# Patient Record
Sex: Male | Born: 1950 | Race: Black or African American | Hispanic: No | Marital: Single | Smoking: Never smoker
Health system: Southern US, Community
[De-identification: ages and names within clinical notes are randomized; demographics above are authoritative.]

## PROBLEM LIST (undated history)

## (undated) DIAGNOSIS — IMO0001 Reserved for inherently not codable concepts without codable children: Secondary | ICD-10-CM

## (undated) DIAGNOSIS — H9190 Unspecified hearing loss, unspecified ear: Secondary | ICD-10-CM

## (undated) DIAGNOSIS — H5200 Hypermetropia, unspecified eye: Secondary | ICD-10-CM

## (undated) HISTORY — DX: Hypermetropia, unspecified eye: H52.00

## (undated) HISTORY — DX: Reserved for inherently not codable concepts without codable children: IMO0001

## (undated) HISTORY — DX: Unspecified hearing loss, unspecified ear: H91.90

---

## 2020-04-21 ENCOUNTER — Ambulatory Visit: Payer: Self-pay | Admitting: Family Medicine

## 2020-04-21 DIAGNOSIS — Z0289 Encounter for other administrative examinations: Secondary | ICD-10-CM | POA: Insufficient documentation

## 2020-05-22 DIAGNOSIS — Z049 Encounter for examination and observation for unspecified reason: Secondary | ICD-10-CM | POA: Diagnosis not present

## 2020-05-22 DIAGNOSIS — Z23 Encounter for immunization: Secondary | ICD-10-CM | POA: Diagnosis not present

## 2020-05-22 DIAGNOSIS — Z114 Encounter for screening for human immunodeficiency virus [HIV]: Secondary | ICD-10-CM | POA: Diagnosis not present

## 2020-06-03 ENCOUNTER — Other Ambulatory Visit (HOSPITAL_COMMUNITY)
Admission: RE | Admit: 2020-06-03 | Discharge: 2020-06-03 | Disposition: A | Discharge status: Home / Self Care | Payer: Medicaid Other | Source: Ambulatory Visit | Attending: Family Medicine | Admitting: Family Medicine

## 2020-06-03 ENCOUNTER — Ambulatory Visit (INDEPENDENT_AMBULATORY_CARE_PROVIDER_SITE_OTHER): Payer: Medicaid Other | Admitting: Family Medicine

## 2020-06-03 ENCOUNTER — Other Ambulatory Visit: Payer: Self-pay

## 2020-06-03 VITALS — BP 110/66 | HR 83 | Ht 70.0 in | Wt 151.0 lb

## 2020-06-03 DIAGNOSIS — H547 Unspecified visual loss: Secondary | ICD-10-CM

## 2020-06-03 DIAGNOSIS — Z0289 Encounter for other administrative examinations: Secondary | ICD-10-CM

## 2020-06-03 DIAGNOSIS — J929 Pleural plaque without asbestos: Secondary | ICD-10-CM | POA: Diagnosis not present

## 2020-06-03 NOTE — Assessment & Plan Note (Signed)
Pleural thickening noted on overseas CXR. DDX includes pleural effusion, occupational lung disease, mesothelioma. -Repeat CXR

## 2020-06-03 NOTE — Progress Notes (Signed)
Patient Name: Jeremy Rivas Date of Birth: Jan 12, 1951 Date of Visit: 06/03/20 PCP: Martyn Malay, MD  Chief Complaint: refugee intake examination, muffled hearing and blurred vision.  The patient's preferred language is Kinyarwanda. An interpreter was used for the entire visit.  Interpreter Name or ID: Rosanna Randy    Subjective: Jeremy Rivas is a pleasant 69 y.o. presenting today for an initial refugee and immigrant clinic visit.  Patient reports muffled hearing since he boarded the airplane coming over to the country. He has attempted popping his ears with no improvement.  Patient also reports blurred vision, which he attributes as being related to the muffled hearing. He states his vision is not always blurry and he has never had his eyes evaluated before. Patient reports previously using reading glasses for a short time period with some improvement.    ROS:  Denies weight loss, night sweats, cough   PMH: None  PSH: None   FH: Unsure   Allergies:  None   Current Medications:  None   Social History: Tobacco Use: Never smoke Alcohol Use: yes, twice a week beer  Refugee Information Number of Immediate Family Members: 6 Number of Immediate Family Members in [place]: 6 Date of Arrival: 03/26/20 Country of Birth: Other Other Country of Birth:: [place]:: [place]: Other Other Location of [place]:: [place] Duration in [place]: 16-19 years Reason for [place]: Political opinion Primary Language: Kinyarwanda/Rwanda Able to Read in Primary Language: Yes Able to Write in Primary Language: Yes Education: None Prior Work: Psychologist, sport and exercise Marital Status: Married Health Department Labs Completed: Yes   Date of Overseas Exam: 03/08/2017 Review of Overseas Exam: 06/03/2020 Pre-Departure Treatment: albendazole, coartem, PZQ  Overseas Vaccines Reviewed and Updated in [place] 06/03/2020     Vitals:   06/03/20 1417  BP: 110/66  Pulse: 83  SpO2: 96%   HEENT: Sclera anicteric. Dentition is poor. Appears well hydrated. Neck: Supple Cardiac: Regular rate and rhythm. Normal S1/S2. No murmurs, rubs, or gallops appreciated. Lungs: Clear bilaterally to ascultation.  Abdomen: Normoactive bowel sounds. No tenderness to deep or light palpation. No rebound. Difficult to appreciate liver and spleen due to voluntarily tensed abdominal muscles Extremities: Warm, well perfused without edema.  Skin: no obvious rashes or lesions  Psych: Pleasant and appropriate  MSK: 5/5 strength in bilateral UE and LE   Pleural thickening Pleural thickening noted on overseas CXR. DDX includes pleural effusion, occupational lung disease, mesothelioma. -Repeat CXR  Vision impairment Occasional blurred vision, previously used reading glasses with some improvement. No associated symptoms including headaches, N/V. BP in office today 110/66, not likely related to blood pressure elevations.  -Opthalmology referral placed -Will follow-up blood sugar levels to ensure diabetes not a contributing factor.   Designated Ad[employer] account planner signed with agency Yes.   Release of information signed for Health Department Yes.   Return to care in 1 month in [facility] with resident physician and PCP.   Vaccines: 2nd COVID vaccine due 12/9 at HD.   To follow-up: -Repeat spleen exam, patient unable to fully relax abdomen during exam  -Further investigate alcohol use, patient did not quantify how many alcoholic drinks he has when he drinks. -Follow-up hearing, consider audiology referral. Also counsel patient on appropriate ear cleaning techniques.  - Review NCIR for MMR vaccine--only received Hep B and Td overseas.  - Needs UA and Urine Cytology at follow up.   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 For congestion--consider flonase.   I discussed the plan of care with the  resident physician and agree with below documentation.  Dorris Singh, MD

## 2020-06-03 NOTE — Patient Instructions (Signed)
It was wonderful to see you today.  Please bring ALL of your medications with you to every visit.   Today we talked about:  - Referral to [facility] will call Huntley Dec with your appointment   An x-ray was ordered for you---you do not need an appointment to have this completed.  I recommend going to [facility] [address] [place] [phone] [place]   If the results are normal,I will send you a letter  I will call you with results if anything is abnormal    We will call you with blood work    Thank you for choosing [facility].   Please call [phone] with any questions about today's appointment.  Please be sure to schedule follow up at the front  desk before you leave today.   Terisa Starr, MD  Family Medicine

## 2020-06-03 NOTE — Assessment & Plan Note (Addendum)
Occasional blurred vision, previously used reading glasses with some improvement. No associated symptoms including headaches, N/V. BP in office today 110/66, not likely related to blood pressure elevations.  -Opthalmology referral placed -Will follow-up blood sugar levels to ensure diabetes not a contributing factor.

## 2020-06-04 ENCOUNTER — Ambulatory Visit
Admission: RE | Admit: 2020-06-04 | Discharge: 2020-06-04 | Disposition: A | Discharge status: Home / Self Care | Payer: No Typology Code available for payment source | Source: Ambulatory Visit | Attending: Family Medicine | Admitting: Family Medicine

## 2020-06-04 DIAGNOSIS — J929 Pleural plaque without asbestos: Secondary | ICD-10-CM

## 2020-06-05 ENCOUNTER — Telehealth: Payer: Self-pay

## 2020-06-05 LAB — URINE CYTOLOGY ANCILLARY ONLY
Chlamydia: NEGATIVE
Comment: NEGATIVE
Comment: NORMAL
Neisseria Gonorrhea: NEGATIVE

## 2020-06-05 NOTE — Telephone Encounter (Signed)
[facility] Radiology LVM on nurse line making sure PCP is aware of recent Xray findings. I called them back to inform we have received report and will send a message to provider.

## 2020-06-05 NOTE — Telephone Encounter (Signed)
Noted. Will need CT of chest to further evaluate.  Will discuss with PCP, Dr. Wynelle Link, and patient.  Terisa Starr, MD  Family Medicine Teaching Service

## 2020-06-06 LAB — COMPREHENSIVE METABOLIC PANEL
ALT: 14 IU/L (ref 0–44)
AST: 21 IU/L (ref 0–40)
Albumin/Globulin Ratio: 1.1 — ABNORMAL LOW (ref 1.2–2.2)
Albumin: 4 g/dL (ref 3.8–4.8)
Alkaline Phosphatase: 63 IU/L (ref 44–121)
BUN/Creatinine Ratio: 17 (ref 10–24)
BUN: 13 mg/dL (ref 8–27)
Bilirubin Total: <0.2 mg/dL (ref 0.0–1.2)
CO2: 24 mmol/L (ref 20–29)
Calcium: 8.5 mg/dL — ABNORMAL LOW (ref 8.6–10.2)
Chloride: 103 mmol/L (ref 96–106)
Creatinine, Ser: 0.77 mg/dL (ref 0.76–1.27)
GFR calc Af Amer: 107 mL/min/{1.73_m2} (ref >59)
GFR calc non Af Amer: 93 mL/min/{1.73_m2} (ref >59)
Globulin, Total: 3.6 g/dL (ref 1.5–4.5)
Glucose: 101 mg/dL — ABNORMAL HIGH (ref 65–99)
Potassium: 4.9 mmol/L (ref 3.5–5.2)
Sodium: 138 mmol/L (ref 134–144)
Total Protein: 7.6 g/dL (ref 6.0–8.5)

## 2020-06-06 LAB — HEPATITIS B SURFACE ANTIGEN: Hepatitis B Surface Ag: NEGATIVE

## 2020-06-06 LAB — CBC WITH DIFFERENTIAL/PLATELET
Basophils Absolute: 0 10*3/uL (ref 0.0–0.2)
Basos: 1 %
EOS (ABSOLUTE): 0.5 10*3/uL — ABNORMAL HIGH (ref 0.0–0.4)
Eos: 12 %
Hematocrit: 40.6 % (ref 37.5–51.0)
Hemoglobin: 13.9 g/dL (ref 13.0–17.7)
Immature Grans (Abs): 0 10*3/uL (ref 0.0–0.1)
Immature Granulocytes: 1 %
Lymphocytes Absolute: 1.2 10*3/uL (ref 0.7–3.1)
Lymphs: 32 %
MCH: 30.8 pg (ref 26.6–33.0)
MCHC: 34.2 g/dL (ref 31.5–35.7)
MCV: 90 fL (ref 79–97)
Monocytes Absolute: 0.4 10*3/uL (ref 0.1–0.9)
Monocytes: 9 %
Neutrophils Absolute: 1.7 10*3/uL (ref 1.4–7.0)
Neutrophils: 45 %
Platelets: 232 10*3/uL (ref 150–450)
RBC: 4.51 x10E6/uL (ref 4.14–5.80)
RDW: 12.7 % (ref 11.6–15.4)
WBC: 3.8 10*3/uL (ref 3.4–10.8)

## 2020-06-06 LAB — LIPID PANEL
Chol/HDL Ratio: 2.7 ratio (ref 0.0–5.0)
Cholesterol, Total: 135 mg/dL (ref 100–199)
HDL: 50 mg/dL (ref >39)
LDL Chol Calc (NIH): 56 mg/dL (ref 0–99)
Triglycerides: 174 mg/dL — ABNORMAL HIGH (ref 0–149)
VLDL Cholesterol Cal: 29 mg/dL (ref 5–40)

## 2020-06-06 LAB — HEPATITIS B CORE ANTIBODY, TOTAL: Hep B Core Total Ab: POSITIVE — AB

## 2020-06-06 LAB — HEPATITIS B SURFACE ANTIBODY, QUANTITATIVE: Hepatitis B Surf Ab Quant: 17.9 m[IU]/mL (ref >9.9)

## 2020-06-06 LAB — RPR: RPR Ser Ql: NONREACTIVE

## 2020-06-06 LAB — STRONGYLOIDES, AB, IGG: Strongyloides, Ab, IgG: POSITIVE — AB

## 2020-06-06 LAB — HIV ANTIBODY (ROUTINE TESTING W REFLEX): HIV Screen 4th Generation wRfx: NONREACTIVE

## 2020-06-06 LAB — HCV AB W REFLEX TO QUANT PCR: HCV Ab: <0.1 s/co ratio (ref 0.0–0.9)

## 2020-06-06 LAB — TSH: TSH: 1.11 u[IU]/mL (ref 0.450–4.500)

## 2020-06-06 LAB — HCV INTERPRETATION

## 2020-06-08 ENCOUNTER — Telehealth: Payer: Self-pay | Admitting: Family Medicine

## 2020-06-08 DIAGNOSIS — B789 Strongyloidiasis, unspecified: Secondary | ICD-10-CM | POA: Insufficient documentation

## 2020-06-08 DIAGNOSIS — D721 Eosinophilia, unspecified: Secondary | ICD-10-CM | POA: Insufficient documentation

## 2020-06-08 DIAGNOSIS — J9859 Other diseases of mediastinum, not elsewhere classified: Secondary | ICD-10-CM | POA: Insufficient documentation

## 2020-06-08 NOTE — Telephone Encounter (Signed)
The patient speaks [place] Plants as their primary language.  An interpreter was used for the entire visit.   Unable to reach patient X2. I was able to reach daughter (she is not with patient). She ensured they will attend visit on 12/8.   Lab review - Hep B exposed, immune - Eosinophilia (will need repeat in 3-6 months, reminder to self) - Needs Loa loa as positive for strongyloides. If loa loa negative, will need treated for strongyloides and then a repeat CBC with diff (re: eosinophils)  - Hep C negative, HIV negative, RPR negative, GC/CT reviewed, negative  - Most important,  At follow up recommend: - Ordering and scheduling non-contrast chest CT. Please let patient know time, date of appointment. Please also let his case manager, Huntley Dec, know about the CT.  - Follow up visit in next 1 month between 04-1129 AM. He needs thick and thin smears between 10-2 PM. Appointment on 12/8 is too late to collect this.   Routing to provider and PCP.   Terisa Starr, MD  Family Medicine Teaching Service

## 2020-06-11 ENCOUNTER — Encounter: Payer: Self-pay | Admitting: Family Medicine

## 2020-06-11 ENCOUNTER — Other Ambulatory Visit: Payer: Self-pay

## 2020-06-11 ENCOUNTER — Ambulatory Visit (INDEPENDENT_AMBULATORY_CARE_PROVIDER_SITE_OTHER): Payer: Medicaid Other | Admitting: Family Medicine

## 2020-06-11 VITALS — BP 114/60 | HR 89 | Ht 70.0 in | Wt 153.4 lb

## 2020-06-11 DIAGNOSIS — D721 Eosinophilia, unspecified: Secondary | ICD-10-CM | POA: Diagnosis not present

## 2020-06-11 DIAGNOSIS — Z0289 Encounter for other administrative examinations: Secondary | ICD-10-CM

## 2020-06-11 DIAGNOSIS — Z289 Immunization not carried out for unspecified reason: Secondary | ICD-10-CM

## 2020-06-11 DIAGNOSIS — R911 Solitary pulmonary nodule: Secondary | ICD-10-CM

## 2020-06-11 DIAGNOSIS — B789 Strongyloidiasis, unspecified: Secondary | ICD-10-CM

## 2020-06-11 DIAGNOSIS — J9859 Other diseases of mediastinum, not elsewhere classified: Secondary | ICD-10-CM | POA: Diagnosis not present

## 2020-06-11 DIAGNOSIS — H6122 Impacted cerumen, left ear: Secondary | ICD-10-CM

## 2020-06-11 DIAGNOSIS — Z23 Encounter for immunization: Secondary | ICD-10-CM | POA: Diagnosis not present

## 2020-06-11 DIAGNOSIS — J929 Pleural plaque without asbestos: Secondary | ICD-10-CM | POA: Diagnosis not present

## 2020-06-11 LAB — POCT URINALYSIS DIP (CLINITEK)
Bilirubin, UA: NEGATIVE
Blood, UA: NEGATIVE
Glucose, UA: NEGATIVE mg/dL
Ketones, POC UA: NEGATIVE mg/dL
Leukocytes, UA: NEGATIVE
Nitrite, UA: NEGATIVE
POC PROTEIN,UA: NEGATIVE
Spec Grav, UA: 1.015 (ref 1.010–1.025)
Urobilinogen, UA: 0.2 E.U./dL
pH, UA: 6 (ref 5.0–8.0)

## 2020-06-11 LAB — POCT UA - MICROSCOPIC ONLY

## 2020-06-11 MED ORDER — DEBROX 6.5 % OT SOLN: 5.0000 [drp] | Freq: Two times a day (BID) | OTIC | 0 refills | Status: DC

## 2020-06-11 NOTE — Progress Notes (Addendum)
    SUBJECTIVE:   CHIEF COMPLAINT / HPI: f/u labs  Preferred language is Kinyarwanda. Interpreter used for the entire visit.  Most recently seen on 11/30 for refugee health examination. Was having muffled hearing since boarding plane coming over to the country. Still reporting muffled hearing today, mostly in left ear. Notable labs/imaging - strongyloides positive - eosinophilia 0.5 - hepatitis B: surface antigen negative, core antibody positive, surface antibody 17.9 (immune) - negative for hep C, HIV, RPR, GC/CT - CXR with 1cm mediastinal mass, see below  CXR 06/04/20 Nodular opacity in or overlying the superior anterior mediastinum seen only on lateral view. It is possible that this opacity represents an exostosis arising from the anterior first rib seen on frontal view. Noncontrast enhanced chest CT to further evaluate this area would be warranted.  Suspected prominent osteophyte along the right lower hemithorax. This area conceivably could represent localized pleural thickening or nodular lesion. Again, noncontrast CT could distinguish between these potential considerations.  Elsewhere lungs appear clear. Cardiac silhouette normal. No well-defined pleural thickening is evident by radiography. CT could be quite helpful for further assessment of the pleura. No adenopathy evident.  PERTINENT  PMH / PSH: pleural thickening, mediastinal mass, eosinophilia, strongyloides infection, vision impairment  OBJECTIVE:   BP 114/60   Pulse 89   Ht _0  (1.778 m)   Wt 153 lb 6 oz (69.6 kg)   SpO2 99%   BMI 22.01 kg/m   General: Elderly male, NAD Eyes: PERRL, EOMI HEENT: Right TM retracted, left TM occluded with cerumen CV: RRR, no murmurs Pulm: CTAB, no wheezes or rales Abdomen: no tenderness, voluntarily tensed abdominal muscles limiting evaluation of liver and spleen, +BS  ASSESSMENT/PLAN:   Pleural thickening Pleural thickening noted on overseas CXR. Repeat CXR  without well-defined pleural thickening. Will await CT chest.  Eosinophilia Noted on recent labs. Likely secondary to parasitic infection. - repeat in 3-6 months  Mediastinal mass 1cm mediastinal nonspecific mass noted on recent CXR, possibly representing exostosis from anterior first rib. 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 - needs thick and thin smears between 10am-2pm evaluate for loa loa, lab appt made 12/22 at 11:15 AM - treatment for strongyloides if negative   Unvaccinated for MMR Has not received MMR vaccine. - MMR immunity - MMR vaccine if not immune  Cerumen impaction, left Likely causing muffled hearing. Attempted ear cleaning x1 with minimal improvement, patient declined second attempt. - Debrox  HCM - Pfizer covid vaccine given today - needs colonoscopy - needs PNA vaccine  F/u scheduled on 07/14/20 at 3:45 PM  Zola Button, MD [place]

## 2020-06-11 NOTE — Assessment & Plan Note (Addendum)
Noted on recent labs. Likely secondary to parasitic infection. - repeat in 3-6 months

## 2020-06-11 NOTE — Assessment & Plan Note (Signed)
1cm mediastinal nonspecific mass noted on recent CXR, possibly representing exostosis from anterior first rib. - CT chest w/o contrast scheduled 12/29 at 11:00 AM

## 2020-06-11 NOTE — Assessment & Plan Note (Signed)
Pleural thickening noted on overseas CXR. Repeat CXR without well-defined pleural thickening. Will await CT chest.

## 2020-06-11 NOTE — Patient Instructions (Addendum)
It was nice seeing you today, Jeremy Rivas.  I sent the ear drops to your pharmacy.  Future appointments: 12/22 at 11:15 AM Lab appointment at [facility] 12/29 at 11:00 AM CT scan appointment at [facility] 1/10 at 3:45 PM Appointment with Dr. Clayborne Artist at [facility] Center  [facility] Address: [address], [place], [place] [address] Phone: [phone]  Stay well, Jeremy Deeds, MD

## 2020-06-11 NOTE — Assessment & Plan Note (Signed)
Noted on recent labs. - needs thick and thin smears between 10am-2pm evaluate for loa loa, lab appt made 12/22 at 11:15 AM - treatment for strongyloides if negative

## 2020-06-12 LAB — MEASLES/MUMPS/RUBELLA IMMUNITY
MUMPS ABS, IGG: 43.7 AU/mL (ref >10.9)
RUBEOLA AB, IGG: >300 AU/mL (ref >16.4)
Rubella Antibodies, IGG: 4.32 index (ref >0.99)

## 2020-06-12 NOTE — Addendum Note (Signed)
Addended by: Littie Deeds D on: 06/12/2020 09:19 AM   Modules accepted: Orders

## 2020-06-25 ENCOUNTER — Other Ambulatory Visit: Payer: No Typology Code available for payment source

## 2020-07-02 ENCOUNTER — Other Ambulatory Visit: Payer: No Typology Code available for payment source

## 2020-07-07 ENCOUNTER — Telehealth: Payer: Self-pay

## 2020-07-07 ENCOUNTER — Telehealth: Payer: Self-pay | Admitting: Family Medicine

## 2020-07-07 DIAGNOSIS — J9859 Other diseases of mediastinum, not elsewhere classified: Secondary | ICD-10-CM

## 2020-07-07 NOTE — Telephone Encounter (Signed)
Attempted to call patient spouse and CSW [name] unsuccessfully.Noted missed appointment for CT scan.I will coordinate with Ivette Loyal RN CM to have this rescheduled. I will also do initial home visit to establish care with congregational nurse office and discuss importance of compliance with appointments as well as assistance with transportation.  Nicole Cella Hady Niemczyk Rn BSn PCCn  [facility] [phone]-cell [phone]-office

## 2020-07-07 NOTE — Telephone Encounter (Signed)
  Care Management     RN Outreach Note  07/07/2020 Name: Jeremy Rivas MRN: [id number] DOB: 06-Apr-1951  Jeremy Rivas is a 70 y.o. year old male who is a primary care patient of Lilland, Percival Spanish, DO . RN Care Manager reached out to [employer] CSW today by phone to discuss the above patient.  [name] was unavailable and I could not leave a message do to her mailbox was full.    Follow Up Plan: The care management team will reach out to the patient again over the next 3-5 days.   [name] RN, BSN, [facility] Care Management Coordinator [facility] Phone: [phone] I Fax: [phone]

## 2020-07-07 NOTE — Telephone Encounter (Signed)
CCM referral placed.  Terisa Starr, MD  Family Medicine Teaching Service

## 2020-07-08 ENCOUNTER — Ambulatory Visit: Payer: No Typology Code available for payment source

## 2020-07-08 NOTE — Congregational Nurse Program (Signed)
  Dept: [phone]   Congregational Nurse Program Note  Date of Encounter: 07/08/2020  Past Medical History: Past Medical History:  Diagnosis Date  . Hearing impaired    Left ear   . Hyperopia     Encounter Details:  Initial home visit completed. Patient home with wife and adult children. 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 Patient has not received  medicaid and has  hospital bills which he is unable to pay.He would like to wait until he gets medicaid prior to scheduling CT scan due to financial difficulties. I have updated telephone numbers.  [name] RN BSN PCCN  [facility] [phone]-cell [phone]-office

## 2020-07-09 NOTE — Patient Instructions (Signed)
  [name]  it was nice speaking with you. Please call me directly [phone] if you have questions about the goals we discussed.  Follow up Plan: RNCM will stay in contact with team members concerning the case for next steps.   Patient Care Plan: RN Case Manager  Problem Identified: Care Coordination   Priority: Medium  Onset Date: 07/08/2020  Goal: Quality of Life Maintained   Start Date: 07/08/2020  Expected End Date: 08/06/2020  Note:   Current Barriers:  . Care coordination needs related to Mediastinal Mass and Solitary Pulmonary Nodule for CT scan w/o contrast  . Clinical Goal(s) related to Mediastinal Mass and Solitary Pulmonary Nodule for CT scan w/o contrast:  Over the next 30 days, patient will:  . Work with the care management team and congregational nurses to address care coordination needs  . Call congregational nurse [name] RN  team with questions or concerns . Verbalize basic understanding of patient centered plan of care established today  Interventions related to Mediastinal Mass and Solitary Pulmonary Nodule for CT scan w/o contrast:  . Evaluation of current treatment plans and patient's adherence to plan as established by provider . Collaborated with [name] RN regarding Initial meeting with the family. . RN Case Designer, multimedia and spoke with [facility] [address] [phone].  Finding that the patient would need to set up a payment plan or make a payment in full. Patient had not insurance. . CSW work [name] [phone] has been contacted by Dr Manson Passey regarding Medicaid for the patient. [name] with appropriate clinical care team members regarding patient needs . Patient was not interviewed or contacted during this encounter.  Patient Self Care Activities related to Mediastinal Mass and Solitary Pulmonary Nodule for CT scan w/o contrast:  . Patient will continue to keep in contact with [name] RN . Patient will Follow  up with health needs . Patient will contact the office with any questions or concerns.         Mr. Geers received Care Management services today:  1. Care Management services include personalized support from designated clinical staff supervised by his physician, including individualized plan of care and coordination with other care providers 2. 24/7 contact [phone] for assistance for urgent and routine care needs. 3. Care Management are voluntary services and be declined at any time by calling the office.  The patient verbalized understanding of instructions provided today and declined a print copy of patient instruction materials.     [name], RN

## 2020-07-09 NOTE — Chronic Care Management (AMB) (Signed)
Care Management   RN Case Manager Initial Note  07/09/2020 Name: Jeremy Rivas MRN: [id number] DOB: [birth year] Jeremy Rivas is a 70 y.o. year old male who sees Jeremy Rivas, Jeremy Spanish, DO for primary care.  Patient is enrolled in a Managed Medicaid plan: No.  The Care Management team was consulted by PCP to assist the patient with . Care Coordination.   RNCM did not  interviewed or contacted the patient during this encounter.  Involvement is due to  provider referral for RN case management and/or care coordination services. See care plan below for details during this encounter.  Follow up Plan: RNCM will stay in contact with team members concerning the case for next steps.  Advanced Directives Status:Not addressed in this encounter.     SDOH (Social Determinants of Health) assessments performed: No     Patient Care Plan: RN Case Manager  Problem Identified: Care Coordination   Priority: Medium  Onset Date: 07/08/2020  Goal: Quality of Life Maintained   Start Date: 07/08/2020  Expected End Date: 08/06/2020  Note:   Current Barriers:  . Care coordination needs related to Mediastinal Mass and Solitary Pulmonary Nodule for CT scan w/o contrast  . Clinical Goal(s) related to Mediastinal Mass and Solitary Pulmonary Nodule for CT scan w/o contrast:  Over the next 30 days, patient will:  . Work with the care management team and congregational nurses to address care coordination needs  . Call congregational nurse Jeremy Bogus RN  team with questions or concerns . Verbalize basic understanding of patient centered plan of care established today  Interventions related to Mediastinal Mass and Solitary Pulmonary Nodule for CT scan w/o contrast:  . Evaluation of current treatment plans and patient's adherence to plan as established by provider . Collaborated with Jeremy Bogus RN regarding Initial meeting with the family. . RN Case Designer, multimedia and spoke with [facility] [address]  [phone].  Finding that the patient would need to set up a payment plan or make a payment in full. Patient had not insurance. . CSW work Jeremy Rivas [phone] has been contacted by Dr Manson Passey regarding Medicaid for the patient.  Jeremy Rivas with appropriate clinical care team members regarding patient needs . Patient was not interviewed or contacted during this encounter.  Patient Self Care Activities related to Mediastinal Mass and Solitary Pulmonary Nodule for CT scan w/o contrast:  . Patient will continue to keep in contact with Jeremy Bogus RN . Patient will Follow up with health needs . Patient will contact the office with any questions or concerns.         Outpatient Encounter Medications as of 07/08/2020  Medication Sig  . carbamide peroxide (DEBROX) 6.5 % OTIC solution Place 5 drops into both ears 2 (two) times daily.   No facility-administered encounter medications on file as of 07/08/2020.    Review of patient status, including review of consultants reports, relevant laboratory and other test results, and collaboration with appropriate care team members and the patient's provider was performed as part of comprehensive patient evaluation and provision of chronic care management services.       Information about Care Management services was shared with [name] today including:  1. Care Management services include personalized support from designated clinical staff supervised by his physician, including individualized plan of care and coordination with other care providers 2. Remind patient of 24/7 contact phone numbers to provider's office for assistance with urgent and routine care needs. 3. Care Management services are  voluntary and patient may stop at any time .   Patient agreed to services provided today and verbal consent obtained.

## 2020-07-14 ENCOUNTER — Ambulatory Visit: Payer: No Typology Code available for payment source | Admitting: Family Medicine

## 2020-07-28 ENCOUNTER — Ambulatory Visit: Payer: No Typology Code available for payment source

## 2020-07-28 ENCOUNTER — Telehealth: Payer: Self-pay

## 2020-07-28 NOTE — Telephone Encounter (Signed)
Patient son Minerva Areola contacted and informed of appointments on 08/12/2020 @ 0930 for CT scan and on 09/01/2020 @1045  with PCP.Both addresses provided. Transportation assistance will be provided.   RN BSn PCCN  [facility] [phone]-cell [phone]-office

## 2020-07-28 NOTE — Chronic Care Management (AMB) (Signed)
Care Management    RN Visit Note  07/28/2020 Name: Jeremy Rivas MRN: [id number] DOB: October 20, 1950  Subjective: Jeremy Rivas is a 70 y.o. year old male who is a primary care patient of Lilland, Alana, DO. The care management team was consulted for assistance with disease management and care coordination needs.    The patient was not contacted for this counter for follow up visit in response to provider referral for case management and/or care coordination services.   Consent to Services:   Jeremy Rivas was given information about Care Management services today including:  1. Care Management services includes personalized support from designated clinical staff supervised by his physician, including individualized plan of care and coordination with other care providers 2. 24/7 contact phone numbers for assistance for urgent and routine care needs. 3. The patient may stop case management services at any time by phone call to the office staff.  Patient agreed to services and consent obtained.   Assessment: Review of patient past medical history, allergies, medications, health status, including review of consultants reports, laboratory and other test data, was performed as part of comprehensive evaluation and provision of chronic care management services.   SDOH (Social Determinants of Health) assessments and interventions performed:    Care Plan  No Known Allergies  Outpatient Encounter Medications as of 07/28/2020  Medication Sig  . carbamide peroxide (DEBROX) 6.5 % OTIC solution Place 5 drops into both ears 2 (two) times daily.   No facility-administered encounter medications on file as of 07/28/2020.    Patient Active Problem List   Diagnosis Date Noted  . Eosinophilia 06/08/2020  . 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 Pleural thickening 06/03/2020  . Vision impairment 06/03/2020  . Refugee health examination 04/21/2020     Conditions to be addressed/monitored: Appointments  Care Plan : RN Case Manager  Updates made by [name], RN since 07/28/2020 12:00 AM  Problem: Care Coordination   Priority: Medium  Onset Date: 07/08/2020  Goal: Quality of Life Maintained   Start Date: 07/08/2020  Expected End Date: 08/06/2020  This Visit's Progress: On track  Priority: High  Note:   Current Barriers:  . Care coordination needs related to Mediastinal Mass and Solitary Pulmonary Nodule for CT scan w/o contrast  . Clinical Goal(s) related to Mediastinal Mass and Solitary Pulmonary Nodule for CT scan w/o contrast:  Over the next 30 days, patient will:  . Work with the care management team and congregational nurses to address care coordination needs  . Call congregational nurse [name] RN  team with questions or concerns . Verbalize basic understanding of patient centered plan of care established today  Interventions related to Mediastinal Mass and Solitary Pulmonary Nodule for CT scan w/o contrast:  . Evaluation of current treatment plans and patient's adherence to plan as established by provider . Collaborated with [name] RN regarding Initial meeting with the family. . RN Case Designer, multimedia and spoke with [name] [address] [phone].  Finding that the patient would need to set up a payment plan or make a payment in full. Patient had not insurance. . CSW work [name] [phone] has been contacted by Dr Manson Passey regarding Medicaid for the patient. [name] with appropriate clinical care team members regarding patient needs . Patient was not interviewed or contacted during this encounter. . Patient's appointment for CT scan was rescheduled for 08/12/20 @ 930 am to be there at 910 for check in  and bring copy of insurance card and ID.  They will go to [facility] at [employer]. [place].   Information was given to [facility] RN. Marland Kitchen Patient and his wife  was also scheduled for an appointment with Dr Clayborne Artist on 09/01/20 @ 1045 am [name] RN was notified and will inform the patient of both appointments.  Patient Self Care Activities related to Mediastinal Mass and Solitary Pulmonary Nodule for CT scan w/o contrast:  . Patient will continue to keep in contact with [name] RN . Patient will Follow up with health needs . Patient will contact the office with any questions or concerns.   Follow up Plan: RNCM will stay in contact with team members concerning the case for next steps.     [name] RN, BSN, [facility] Care Management Coordinator [facility] Phone: [phone] I Fax: [phone]

## 2020-07-28 NOTE — Patient Instructions (Signed)
Visit Information   The patient verbalized understanding of instructions, educational materials, and care plan provided today and declined offer to receive copy of patient instructions, educational materials, and care plan.   Follow up Plan: RNCM will stay in contact with team members concerning the case for next steps.   [name] RN, BSN, [facility] Care Management Coordinator [facility] Phone: [phone] I Fax: [phone]

## 2020-08-07 ENCOUNTER — Ambulatory Visit: Payer: No Typology Code available for payment source | Admitting: Family Medicine

## 2020-08-11 ENCOUNTER — Telehealth: Payer: Self-pay

## 2020-08-11 NOTE — Telephone Encounter (Signed)
Called patient to remind him of CT scan appointment tomorrow.Patient has no transportation. Transportation assistance provided by [employer].  [name] RN BSN PCCN  [facility] [phone]-cell [phone]-office

## 2020-08-12 ENCOUNTER — Ambulatory Visit
Admission: RE | Admit: 2020-08-12 | Discharge: 2020-08-12 | Disposition: A | Discharge status: Home / Self Care | Payer: No Typology Code available for payment source | Source: Ambulatory Visit | Attending: Family Medicine | Admitting: Family Medicine

## 2020-08-12 DIAGNOSIS — R911 Solitary pulmonary nodule: Secondary | ICD-10-CM

## 2020-08-12 DIAGNOSIS — J9859 Other diseases of mediastinum, not elsewhere classified: Secondary | ICD-10-CM

## 2020-08-12 DIAGNOSIS — R918 Other nonspecific abnormal finding of lung field: Secondary | ICD-10-CM | POA: Diagnosis not present

## 2020-09-01 ENCOUNTER — Encounter: Payer: Self-pay | Admitting: Family Medicine

## 2020-09-01 ENCOUNTER — Telehealth: Payer: Self-pay

## 2020-09-01 ENCOUNTER — Other Ambulatory Visit: Payer: Self-pay

## 2020-09-01 ENCOUNTER — Ambulatory Visit (INDEPENDENT_AMBULATORY_CARE_PROVIDER_SITE_OTHER): Payer: Medicaid Other | Admitting: Family Medicine

## 2020-09-01 VITALS — BP 122/80 | HR 72 | Ht 70.0 in | Wt 157.4 lb

## 2020-09-01 DIAGNOSIS — B789 Strongyloidiasis, unspecified: Secondary | ICD-10-CM | POA: Diagnosis not present

## 2020-09-01 DIAGNOSIS — R918 Other nonspecific abnormal finding of lung field: Secondary | ICD-10-CM

## 2020-09-01 NOTE — Telephone Encounter (Signed)
Transportation assistance provided from the doctor`s to home.  [name] RN BSn PCCN  [facility] Nurse [phone]-cell [phone]-office

## 2020-09-01 NOTE — Patient Instructions (Signed)
It was a great seeing you today!  Today we are going to get a lab from you to check for a parasitic infection.  If this is negative, we will send in a medication for you to take.

## 2020-09-01 NOTE — Progress Notes (Signed)
    SUBJECTIVE:   CHIEF COMPLAINT / HPI:   Audio interpreter present until live interpreter was able to participate in appointment.  History of strongyloides infection: Patient is a history of Strongyloides infection and has previously missed appointments to get level of the testing.  Patient reports no complaints this time that he feels very good.  He is unsure of why he has to be here at this appointment.  Pulmonary nodules on CT Chest CT on 08/12/2020 showed few scattered solid pulmonary nodules in the right lung.  Recommendation for noncontrast CT of 3 to 6 months recommended.  Patient reports no respiratory symptoms.   PERTINENT  PMH / PSH: Strongyloides infection  OBJECTIVE:   BP 122/80   Pulse 72   Ht 5\' 10"  (1.778 m)   Wt 157 lb 6.4 oz (71.4 kg)   SpO2 99%   BMI 22.58 kg/m   Gen: NAD, well-appearing, well-nourished Pulm: comfortable on room air, no increased WOB MSK: able to ambulate well  ASSESSMENT/PLAN:  History of strongyloides infection Patiently previously found to have strongyloides infection and has missed several appointments to get loa loa testing.  As patient is in the timeline for testing (between 10 AM to 2 PM), we will obtain parasite exam today.  If this is negative, patient will be able to get ivermectin to treat the strongyloides.   - Parasite exam today (for loa loa)  Pulmonary nodules on CT CT on 08/12/2020 found scattered pulmonary nodules in the right lung with recommendation for Noncon CT at 3 to 6 months.  If nodules are stable at the time of that CT, then repeat at 18 to 24 months (from 08/12/2020)-optional for low risk patients are recommended for high risk patients. - Repeat non-con CT between May-Aug 2022   Sep 2022, DO [facility]

## 2020-09-01 NOTE — Telephone Encounter (Signed)
Patient contacted and reminded of today`s appointment.  [name] RN BSn PCCN  [facility] [phone]-cell [phone]-office

## 2020-09-03 LAB — PARASITE EXAM, BLOOD

## 2020-09-05 ENCOUNTER — Other Ambulatory Visit: Payer: Self-pay | Admitting: Family Medicine

## 2020-09-05 DIAGNOSIS — B789 Strongyloidiasis, unspecified: Secondary | ICD-10-CM

## 2020-09-05 MED ORDER — IVERMECTIN 3 MG PO TABS: 200.0000 ug/kg | ORAL_TABLET | Freq: Every day | ORAL | 0 refills | Status: AC

## 2020-09-09 ENCOUNTER — Telehealth: Payer: Self-pay

## 2020-09-09 NOTE — Telephone Encounter (Signed)
Spoke with pts wife informed that his medication is ready for pick up at the pharmacy. Through interpreter Fayrene Fearing [phone].   pts wife understood. Aquilla Solian, CMA

## 2020-09-09 NOTE — Telephone Encounter (Signed)
-----   Message from Evelena Leyden, DO sent at 09/05/2020  4:26 PM EST ----- Regarding: Please call patient Can you please call this patient to let him know that his antiparasitic medications have been sent to the pharmacy. He is to take half of the pills one day and the other half the next day as instructed on the bottle and discussed previously.  Thanks,  Alana Lilland, DO

## 2020-09-29 ENCOUNTER — Ambulatory Visit: Payer: Medicaid Other | Admitting: Family Medicine

## 2020-12-05 ENCOUNTER — Encounter: Payer: Self-pay | Admitting: Family Medicine

## 2020-12-09 DIAGNOSIS — Z23 Encounter for immunization: Secondary | ICD-10-CM | POA: Diagnosis not present

## 2021-01-08 ENCOUNTER — Emergency Department (HOSPITAL_COMMUNITY)
Admission: EM | Admit: 2021-01-08 | Discharge: 2021-01-09 | Disposition: A | Discharge status: Home / Self Care | Payer: Medicaid Other | Attending: Emergency Medicine | Admitting: Emergency Medicine

## 2021-01-08 ENCOUNTER — Encounter (HOSPITAL_COMMUNITY): Payer: Self-pay | Admitting: Emergency Medicine

## 2021-01-08 DIAGNOSIS — R109 Unspecified abdominal pain: Secondary | ICD-10-CM | POA: Diagnosis not present

## 2021-01-08 DIAGNOSIS — R102 Pelvic and perineal pain: Secondary | ICD-10-CM | POA: Diagnosis not present

## 2021-01-08 DIAGNOSIS — R300 Dysuria: Secondary | ICD-10-CM | POA: Insufficient documentation

## 2021-01-08 LAB — CBC WITH DIFFERENTIAL/PLATELET
Abs Immature Granulocytes: 0.01 10*3/uL (ref 0.00–0.07)
Basophils Absolute: 0 10*3/uL (ref 0.0–0.1)
Basophils Relative: 1 %
Eosinophils Absolute: 0.9 10*3/uL — ABNORMAL HIGH (ref 0.0–0.5)
Eosinophils Relative: 22 %
HCT: 41.9 % (ref 39.0–52.0)
Hemoglobin: 14.2 g/dL (ref 13.0–17.0)
Immature Granulocytes: 0 %
Lymphocytes Relative: 30 %
Lymphs Abs: 1.3 10*3/uL (ref 0.7–4.0)
MCH: 31.3 pg (ref 26.0–34.0)
MCHC: 33.9 g/dL (ref 30.0–36.0)
MCV: 92.5 fL (ref 80.0–100.0)
Monocytes Absolute: 0.4 10*3/uL (ref 0.1–1.0)
Monocytes Relative: 10 %
Neutro Abs: 1.6 10*3/uL — ABNORMAL LOW (ref 1.7–7.7)
Neutrophils Relative %: 37 %
Platelets: 174 10*3/uL (ref 150–400)
RBC: 4.53 MIL/uL (ref 4.22–5.81)
RDW: 13.1 % (ref 11.5–15.5)
WBC: 4.3 10*3/uL (ref 4.0–10.5)
nRBC: 0 % (ref 0.0–0.2)

## 2021-01-08 LAB — URINALYSIS, ROUTINE W REFLEX MICROSCOPIC
Bilirubin Urine: NEGATIVE
Glucose, UA: NEGATIVE mg/dL
Hgb urine dipstick: NEGATIVE
Ketones, ur: NEGATIVE mg/dL
Leukocytes,Ua: NEGATIVE
Nitrite: NEGATIVE
Protein, ur: NEGATIVE mg/dL
Specific Gravity, Urine: 1.019 (ref 1.005–1.030)
pH: 8 (ref 5.0–8.0)

## 2021-01-08 LAB — COMPREHENSIVE METABOLIC PANEL
ALT: 13 U/L (ref 0–44)
AST: 19 U/L (ref 15–41)
Albumin: 3.4 g/dL — ABNORMAL LOW (ref 3.5–5.0)
Alkaline Phosphatase: 42 U/L (ref 38–126)
Anion gap: 5 (ref 5–15)
BUN: 7 mg/dL — ABNORMAL LOW (ref 8–23)
CO2: 23 mmol/L (ref 22–32)
Calcium: 8.2 mg/dL — ABNORMAL LOW (ref 8.9–10.3)
Chloride: 107 mmol/L (ref 98–111)
Creatinine, Ser: 0.69 mg/dL (ref 0.61–1.24)
GFR, Estimated: >60 mL/min (ref >60)
Glucose, Bld: 108 mg/dL — ABNORMAL HIGH (ref 70–99)
Potassium: 4.2 mmol/L (ref 3.5–5.1)
Sodium: 135 mmol/L (ref 135–145)
Total Bilirubin: 0.6 mg/dL (ref 0.3–1.2)
Total Protein: 6.9 g/dL (ref 6.5–8.1)

## 2021-01-08 LAB — LIPASE, BLOOD: Lipase: 37 U/L (ref 11–51)

## 2021-01-08 MED ORDER — LACTATED RINGERS IV BOLUS
1000.0000 mL | Freq: Once | INTRAVENOUS | Status: AC
  Administered 2021-01-08: 1000 mL via INTRAVENOUS

## 2021-01-08 MED ORDER — FENTANYL CITRATE (PF) 100 MCG/2ML IJ SOLN
50.0000 ug | Freq: Once | INTRAMUSCULAR | Status: AC
  Administered 2021-01-08: 50 ug via INTRAVENOUS
  Filled 2021-01-08: qty 2

## 2021-01-08 NOTE — ED Triage Notes (Signed)
Using medical interpreter- patient c/o lower back pain, lower abdominal pain and hard for him to walk properly x one week. Reports pain in his penile area, denies any dysuria. States he might be constipated since the past two days having hard stools.

## 2021-01-08 NOTE — ED Provider Notes (Signed)
[facility] EMERGENCY DEPARTMENT Provider Note   CSN: [id number] Arrival date & time: 01/08/21  1641     History Chief Complaint  Patient presents with   Abdominal Pain   Back Pain    Jeremy Rivas is a 70 y.o. male.  Speaks rwandan.  Has had pain in groin/penis and right and mid-lower back for about a week, progressively worsening, today a lot worse in the back . Started in stomach. Hasn't tried anything for the symptoms. No h/o kidney stone. No nausea, vomiting, diarrhea or constipation. Does have dysuria but no malodorus urine, frequency or other associated symptoms.  No history of same.   A language interpreter was used.  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 No pertinent surgical history.     No family history on file.  Social History   Tobacco Use   Smoking status: Never   Smokeless tobacco: Never  Substance Use Topics   Alcohol use: Yes   Drug use: Never    Home Medications Prior to Admission medications   Medication Sig Start Date End Date Taking? Authorizing Provider  dicyclomine (BENTYL) 20 MG tablet Take 1 tablet (20 mg total) by mouth 2 (two) times daily. 01/09/21  Yes Torianna Junio, Barbara Cower, MD  carbamide peroxide (DEBROX) 6.5 % OTIC solution Place 5 drops into both ears 2 (two) times daily. 06/11/20   Littie Deeds, MD    Allergies    Patient has no known allergies.  Review of Systems   Review of Systems  Gastrointestinal:  Positive for abdominal pain.  Musculoskeletal:  Positive for back pain.  All other systems reviewed and are  negative.  Physical Exam Updated Vital Signs BP 125/81   Pulse 69   Temp 98.4 F (36.9 C) (Oral)   Resp 20   SpO2 100%   Physical Exam Vitals and nursing note reviewed.  Constitutional:      Appearance: He is well-developed.  HENT:     Head: Normocephalic and atraumatic.  Cardiovascular:     Rate and Rhythm: Normal rate.  Pulmonary:     Effort: Pulmonary effort is normal. No respiratory distress.  Abdominal:     General: There is no distension.  Genitourinary:    Penis: Normal.      Testes: Normal.        Right: Tenderness not present.        Left: Tenderness not present.  Musculoskeletal:        General: Normal range of motion.     Cervical back: Normal range of motion.     Lumbar back: Tenderness (lower and right side) present.  Skin:    General: Skin is warm and dry.  Neurological:     Mental Status: He is alert.    ED Results / Procedures / Treatments   Labs (all labs ordered are listed, but only abnormal results are displayed) Labs Reviewed  COMPREHENSIVE METABOLIC PANEL - Abnormal; Notable for the following components:      Result Value   Glucose, Bld 108 (*)    BUN  7 (*)    Calcium 8.2 (*)    Albumin 3.4 (*)    All other components within normal limits  CBC WITH DIFFERENTIAL/PLATELET - Abnormal; Notable for the following components:   Neutro Abs 1.6 (*)    Eosinophils Absolute 0.9 (*)    All other components within normal limits  URINE CULTURE  URINALYSIS, ROUTINE W REFLEX MICROSCOPIC  LIPASE, BLOOD    EKG None  Radiology CT ABDOMEN PELVIS W CONTRAST  Result Date: 01/09/2021 CLINICAL DATA:  Abdominal pain EXAM: CT ABDOMEN AND PELVIS WITH CONTRAST TECHNIQUE: Multidetector CT imaging of the abdomen and pelvis was performed using the standard protocol following bolus administration of intravenous contrast. CONTRAST:  OMNIPAQUE IOHEXOL 300 MG/ML  SOLN COMPARISON:  None. FINDINGS: Lower chest: Dependent atelectasis in the bilateral lower lobes.  Hepatobiliary: Liver is within normal limits. Gallbladder is unremarkable. No intrahepatic or extrahepatic ductal dilatation. Pancreas: Within normal limits. Spleen: Within normal limits. Adrenals/Urinary Tract: Adrenal glands are within normal limits. Kidneys are within normal limits.  No hydronephrosis. Bladder is within normal limits. Stomach/Bowel: Stomach is within normal limits. No evidence of bowel obstruction. Normal appendix (series 3/image 62). No colonic wall thickening or inflammatory changes. Vascular/Lymphatic: No evidence of abdominal aortic aneurysm. No suspicious abdominopelvic lymphadenopathy. Reproductive: Mild prostatomegaly. Other: No abdominopelvic ascites. Musculoskeletal: Mild degenerative changes at L1-2. IMPRESSION: Negative CT abdomen/pelvis. Electronically Signed   By: Charline Bills M.D.   On: 01/09/2021 02:16    Procedures Procedures   Medications Ordered in ED Medications  lactated ringers bolus 1,000 mL (1,000 mLs Intravenous New Bag/Given 01/08/21 2340)  fentaNYL (SUBLIMAZE) injection 50 mcg (50 mcg Intravenous Given 01/08/21 2339)  iohexol (OMNIPAQUE) 300 MG/ML solution 100 mL (100 mLs Intravenous Contrast Given 01/09/21 0206)    ED Course  I have reviewed the triage vital signs and the nursing notes.  Pertinent labs & imaging results that were available during my care of the patient were reviewed by me and considered in my medical decision making (see chart for details).    MDM Rules/Calculators/A&P                          Ct without evidence of prostatitis, kidney stone, bony pathology, appendicitis. Labs w/o evidence of UTI/hepatobiliary pathology or other emergent causes for symptoms. Will try some bentyl and recommend outpatient PCP follow up for further exploration of ysmptoms and workup as required.   Final Clinical Impression(s) / ED Diagnoses Final diagnoses:  Pelvic pain    Rx / DC Orders ED Discharge Orders          Ordered    dicyclomine  (BENTYL) 20 MG tablet  2 times daily        01/09/21 0351             Brinden Kincheloe, Barbara Cower, MD 01/09/21 [phone]

## 2021-01-08 NOTE — ED Provider Notes (Signed)
Emergency Medicine Provider Triage Evaluation Note  Jeremy Rivas , a 70 y.o. male  was evaluated in triage.  Pt complains of lower back pain, abdominal pain.  Reports constipation.  Denies any vomiting, chest pain or urinary symptoms.  Review of Systems  Positive: Abdominal pain, constipation Negative: Vomiting, chest  Physical Exam  BP 118/74   Pulse 78   Temp 98.3 F (36.8 C) (Oral)   Resp 16   SpO2 100%  Gen:   Awake, no distress   Resp:  Normal effort  MSK:   Moves extremities without difficulty  Other:  Abdomen soft  Medical Decision Making  Medically screening exam initiated at 4:58 PM.  Appropriate orders placed.  Jeremy Rivas was informed that the remainder of the evaluation will be completed by another provider, this initial triage assessment does not replace that evaluation, and the importance of remaining in the ED until their evaluation is complete.  Lab work ordered   Dietrich Pates, PA-C 01/08/21 1659    Koleen Distance, MD 01/08/21 2351

## 2021-01-09 ENCOUNTER — Emergency Department (HOSPITAL_COMMUNITY): Payer: Medicaid Other

## 2021-01-09 DIAGNOSIS — R109 Unspecified abdominal pain: Secondary | ICD-10-CM | POA: Diagnosis not present

## 2021-01-09 MED ORDER — IOHEXOL 300 MG/ML  SOLN
100.0000 mL | Freq: Once | INTRAMUSCULAR | Status: AC | PRN
  Administered 2021-01-09: 100 mL via INTRAVENOUS

## 2021-01-09 MED ORDER — DICYCLOMINE HCL 20 MG PO TABS: 20.0000 mg | ORAL_TABLET | Freq: Two times a day (BID) | ORAL | 0 refills | Status: DC

## 2021-01-11 LAB — URINE CULTURE: Culture: <10000 — AB

## 2021-01-12 ENCOUNTER — Telehealth: Payer: Self-pay

## 2021-01-12 NOTE — Telephone Encounter (Addendum)
Transition Care Management Unsuccessful Follow-up Telephone Call  Date of discharge and from where:  01/09/2021-[facility] ED.  Attempts:  1st Attempt  Reason for unsuccessful TCM follow-up call:  Unable to reach patient

## 2021-01-13 ENCOUNTER — Telehealth: Payer: Self-pay

## 2021-01-13 NOTE — Telephone Encounter (Signed)
Transition Care Management Unsuccessful Follow-up Telephone Call  Date of discharge and from where:  [name]  Attempts:  2nd Attempt  Reason for unsuccessful TCM follow-up call:  Unable to leave message

## 2021-01-14 NOTE — Telephone Encounter (Signed)
Transition Care Management Unsuccessful Follow-up Telephone Call  Date of discharge and from where:  [facility]/20[name]  Attempts:  3rd Attempt  Reason for unsuccessful TCM follow-up call:  Unable to leave message

## 2021-08-12 ENCOUNTER — Ambulatory Visit (INDEPENDENT_AMBULATORY_CARE_PROVIDER_SITE_OTHER): Payer: BC Managed Care – PPO

## 2021-08-12 ENCOUNTER — Emergency Department (HOSPITAL_BASED_OUTPATIENT_CLINIC_OR_DEPARTMENT_OTHER): Payer: BC Managed Care – PPO | Admitting: Radiology

## 2021-08-12 ENCOUNTER — Encounter (HOSPITAL_BASED_OUTPATIENT_CLINIC_OR_DEPARTMENT_OTHER): Payer: Self-pay

## 2021-08-12 ENCOUNTER — Emergency Department (HOSPITAL_BASED_OUTPATIENT_CLINIC_OR_DEPARTMENT_OTHER): Payer: BC Managed Care – PPO

## 2021-08-12 ENCOUNTER — Encounter (HOSPITAL_COMMUNITY): Payer: Self-pay | Admitting: Emergency Medicine

## 2021-08-12 ENCOUNTER — Other Ambulatory Visit: Payer: Self-pay

## 2021-08-12 ENCOUNTER — Ambulatory Visit (HOSPITAL_COMMUNITY)
Admission: EM | Admit: 2021-08-12 | Discharge: 2021-08-12 | Disposition: A | Discharge status: Home / Self Care | Payer: BC Managed Care – PPO | Attending: Emergency Medicine | Admitting: Emergency Medicine

## 2021-08-12 ENCOUNTER — Emergency Department (HOSPITAL_BASED_OUTPATIENT_CLINIC_OR_DEPARTMENT_OTHER)
Admission: EM | Admit: 2021-08-12 | Discharge: 2021-08-12 | Disposition: A | Discharge status: Home / Self Care | Payer: BC Managed Care – PPO | Attending: Emergency Medicine | Admitting: Emergency Medicine

## 2021-08-12 DIAGNOSIS — M795 Residual foreign body in soft tissue: Secondary | ICD-10-CM

## 2021-08-12 DIAGNOSIS — J029 Acute pharyngitis, unspecified: Secondary | ICD-10-CM | POA: Diagnosis not present

## 2021-08-12 DIAGNOSIS — R0989 Other specified symptoms and signs involving the circulatory and respiratory systems: Secondary | ICD-10-CM | POA: Insufficient documentation

## 2021-08-12 DIAGNOSIS — R070 Pain in throat: Secondary | ICD-10-CM

## 2021-08-12 LAB — CBC WITH DIFFERENTIAL/PLATELET
Abs Immature Granulocytes: 0.01 10*3/uL (ref 0.00–0.07)
Basophils Absolute: 0 10*3/uL (ref 0.0–0.1)
Basophils Relative: 1 %
Eosinophils Absolute: 0.6 10*3/uL — ABNORMAL HIGH (ref 0.0–0.5)
Eosinophils Relative: 14 %
HCT: 44.5 % (ref 39.0–52.0)
Hemoglobin: 14.8 g/dL (ref 13.0–17.0)
Immature Granulocytes: 0 %
Lymphocytes Relative: 28 %
Lymphs Abs: 1.2 10*3/uL (ref 0.7–4.0)
MCH: 30.8 pg (ref 26.0–34.0)
MCHC: 33.3 g/dL (ref 30.0–36.0)
MCV: 92.7 fL (ref 80.0–100.0)
Monocytes Absolute: 0.4 10*3/uL (ref 0.1–1.0)
Monocytes Relative: 11 %
Neutro Abs: 1.9 10*3/uL (ref 1.7–7.7)
Neutrophils Relative %: 46 %
Platelets: 176 10*3/uL (ref 150–400)
RBC: 4.8 MIL/uL (ref 4.22–5.81)
RDW: 12.8 % (ref 11.5–15.5)
WBC: 4.1 10*3/uL (ref 4.0–10.5)
nRBC: 0 % (ref 0.0–0.2)

## 2021-08-12 LAB — BASIC METABOLIC PANEL
Anion gap: 9 (ref 5–15)
BUN: 7 mg/dL — ABNORMAL LOW (ref 8–23)
CO2: 23 mmol/L (ref 22–32)
Calcium: 8.3 mg/dL — ABNORMAL LOW (ref 8.9–10.3)
Chloride: 102 mmol/L (ref 98–111)
Creatinine, Ser: 0.59 mg/dL — ABNORMAL LOW (ref 0.61–1.24)
GFR, Estimated: >60 mL/min (ref >60)
Glucose, Bld: 114 mg/dL — ABNORMAL HIGH (ref 70–99)
Potassium: 4.4 mmol/L (ref 3.5–5.1)
Sodium: 134 mmol/L — ABNORMAL LOW (ref 135–145)

## 2021-08-12 MED ORDER — IOHEXOL 300 MG/ML  SOLN
80.0000 mL | Freq: Once | INTRAMUSCULAR | Status: AC | PRN
  Administered 2021-08-12: 80 mL via INTRAVENOUS

## 2021-08-12 MED ORDER — LIDOCAINE VISCOUS HCL 2 % MT SOLN: 15.0000 mL | Freq: Once | OROMUCOSAL | Status: DC

## 2021-08-12 NOTE — ED Triage Notes (Signed)
Pt ate fish on Sunday and reports that feels bone or something stuck in right side of throat causing pain. Pt able to swallow without any problems.

## 2021-08-12 NOTE — ED Provider Notes (Signed)
[facility]    CSN: [id number] Arrival date & time: 08/12/21  1303      History   Chief Complaint No chief complaint on file.   HPI Jeremy Rivas is a 71 y.o. male.   Patient is here with family member states that on Sunday felt like a fishbone was stuck on the right side of in his throat.  Patient has not been seen until today for this denies any swallowing difficulties.  No nausea no vomiting no shortness of breath.  Has not been having any emesis or bloody emesis.    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 No pertinent surgical history.     Home Medications    Prior to Admission medications   Medication Sig Start Date End Date Taking? Authorizing Provider  carbamide peroxide (DEBROX) 6.5 % OTIC solution Place 5 drops into both ears 2 (two) times daily. 06/11/20   Littie Deeds, MD  dicyclomine (BENTYL) 20 MG tablet Take 1 tablet (20 mg total) by mouth 2 (two) times daily. 01/09/21   Mesner, Barbara Cower, MD    Family History No family history on file.  Social History Social History   Tobacco Use   Smoking status: Never   Smokeless tobacco: Never  Substance Use Topics   Alcohol use: Yes   Drug use: Never     Allergies   Patient has no known allergies.   Review of Systems Review of Systems  Constitutional:  Negative for chills and fever.  HENT:         Difficulty swallowing pain to right side of throat area states that the patient prior is stuck.  Respiratory: Negative.    Cardiovascular: Negative.   Gastrointestinal: Negative.     Physical Exam Triage Vital Signs ED Triage Vitals [08/12/21 1409]  Enc Vitals Group      BP (!) 142/82     Pulse Rate 79     Resp 16     Temp 97.9 F (36.6 C)     Temp Source Oral     SpO2 98 %     Weight      Height      Head Circumference      Peak Flow      Pain Score      Pain Loc      Pain Edu?      Excl. in GC?    No data found.  Updated Vital Signs BP (!) 142/82 (BP Location: Right Arm)    Pulse 79    Temp 97.9 F (36.6 C) (Oral)    Resp 16    SpO2 98%   Visual Acuity Right Eye Distance:   Left Eye Distance:   Bilateral Distance:    Right Eye Near:   Left Eye Near:    Bilateral Near:     Physical Exam Constitutional:      Appearance: Normal appearance.  HENT:     Right Ear: Tympanic membrane normal.     Left Ear: Tympanic membrane normal.     Nose: Nose normal.     Mouth/Throat:     Pharynx: Posterior oropharyngeal erythema  present.     Tonsils: 1+ on the right. 0 on the left.     Comments: States he has pain pinpoint area to right lateral side of trachea below ear that he has a sensation of a fishbone.  Cardiovascular:     Rate and Rhythm: Normal rate.  Pulmonary:     Effort: Pulmonary effort is normal.  Musculoskeletal:     Cervical back: Normal range of motion. No tenderness.  Neurological:     General: No focal deficit present.     Mental Status: He is alert.     UC Treatments / Results  Labs (all labs ordered are listed, but only abnormal results are displayed) Labs Reviewed - No data to display  EKG   Radiology DG Neck Soft Tissue  Result Date: 08/12/2021 CLINICAL DATA:  Throat pain. EXAM: NECK SOFT TISSUES - 1+ VIEW COMPARISON:  None. FINDINGS: There is no evidence of retropharyngeal soft tissue swelling or epiglottic enlargement. The cervical airway is unremarkable and no radio-opaque foreign body identified. IMPRESSION: Negative. Electronically Signed   By: Lupita Raider M.D.   On: 08/12/2021 14:32    Procedures Procedures (including critical care time)  Medications Ordered in UC Medications - No data to  display  Initial Impression / Assessment and Plan / UC Course  I have reviewed the triage vital signs and the nursing notes.  Pertinent labs & imaging results that were available during my care of the patient were reviewed by me and considered in my medical decision making (see chart for details).     Discussed with patient and family member that the x-ray was negative.  Patient will need to be seen in the emergency room for CT scan or further testing.  If symptoms become worse or he has shortness of breath or bloody emesis he will need to be seen in the emergency room for further scans. Final Clinical Impressions(s) / UC Diagnoses   Final diagnoses:  Foreign body (FB) in soft tissue     Discharge Instructions      Your x-ray was negative not able to rule out other concerns. Patient will need to have a CT scan done if symptoms become worse he can be seen in the emergency room to have this completed and further testing.     ED Prescriptions   None    PDMP not reviewed this encounter.   Coralyn Mark, NP 08/12/21 1445

## 2021-08-12 NOTE — ED Triage Notes (Signed)
Last Sunday eating fish and felt something stuck in throat.  Pain in throat to right side.  Hurst to swallow.  Able to eat and drink but has pain.  Denies SOB.  Breathing without difficulty or obstruction

## 2021-08-12 NOTE — Discharge Instructions (Addendum)
Your x-ray was negative not able to rule out other concerns. Patient will need to have a CT scan done if symptoms become worse he can be seen in the emergency room to have this completed and further testing.

## 2021-08-12 NOTE — ED Notes (Addendum)
[facility] consult to be seen by Dr.Caldwell at 9:45 pm for possible endoscopy following suspected lodge fish bone. Pt airway intact. Stable for POV transport. [facility] Charge Nurse Shanda Bumps F aware. Pt provided with paperwork for next facility.

## 2021-08-12 NOTE — ED Notes (Signed)
Patient transported to CT 

## 2021-08-12 NOTE — ED Provider Notes (Signed)
[facility] EMERGENCY DEPARTMENT Provider Note   CSN: [id number] Arrival date & time: 08/12/21  1520     History  Chief Complaint  Patient presents with   Sore Throat    Jeremy Rivas is a 71 y.o. male.   Sore Throat   71 year old male presenting from the [facility]ppage emergency department to the [facility] emergency department for ENT consult due to concern for possible foreign body lodged in the throat.  The patient was seen at med center droppage earlier today with a complaint of sore throat.  With associated foreign body sensation.  It was thought that he potentially had a fishbone in his throat.  CT with contrast had been obtained.  There is no evidence of foreign body on CT.  ENT was consulted and Dr. Elijah Birk recommended transfer to the [facility] emergency department for bedside laryngoscopy.  Home Medications Prior to Admission medications   Not on File      Allergies    Patient has no allergy information on record.    Review of Systems   Review of Systems  All other systems reviewed and are negative.  Physical Exam Updated Vital Signs BP (!) 150/98    Pulse 72    Temp 98 F (36.7 C)    Resp 20    Wt 72.6 kg    SpO2 100%  Physical Exam Vitals and nursing note reviewed.  Constitutional:      General: He is not in acute distress.    Comments: Resting comfortably in no apparent distress  HENT:     Head: Normocephalic and atraumatic.  Eyes:     Conjunctiva/sclera: Conjunctivae normal.     Pupils: Pupils are equal, round, and reactive to light.  Cardiovascular:     Rate and Rhythm: Normal rate and regular rhythm.  Pulmonary:     Effort: Pulmonary effort is normal. No respiratory distress.  Abdominal:     General: There is no distension.     Tenderness: There is no guarding.  Musculoskeletal:        General: No deformity or signs of injury.     Cervical back: Neck supple.  Skin:    Findings: No lesion or rash.  Neurological:     General:  No focal deficit present.     Mental Status: He is alert. Mental status is at baseline.    ED Results / Procedures / Treatments   Labs (all labs ordered are listed, but only abnormal results are displayed) Labs Reviewed  CBC WITH DIFFERENTIAL/PLATELET - Abnormal; Notable for the following components:      Result Value   Eosinophils Absolute 0.6 (*)    All other components within normal limits  BASIC METABOLIC PANEL - Abnormal; Notable for the following components:   Sodium 134 (*)    Glucose, Bld 114 (*)    BUN 7 (*)    Creatinine, Ser 0.59 (*)    Calcium 8.3 (*)    All other components within normal limits    EKG None  Radiology No results found.  Procedures Procedures    Medications Ordered in ED Medications  iohexol (OMNIPAQUE) 300 MG/ML solution 80 mL (80 mLs Intravenous Contrast Given 08/12/21 2014)    ED Course/ Medical Decision Making/ A&P                           Medical Decision Making Amount and/or Complexity of Data Reviewed Labs: ordered. Radiology: ordered.  Risk Prescription drug management.   71 year old male presenting from the [facility] emergency department to the [facility] emergency department for ENT consult due to concern for possible foreign body lodged in the throat.  The patient was seen at med center droppage earlier today with a complaint of sore throat.  With associated foreign body sensation.  It was thought that he potentially had a fishbone in his throat.  CT with contrast had been obtained.  There is no evidence of foreign body on CT.  ENT was consulted and Dr. Elijah Birk recommended transfer to the [facility] emergency department for bedside laryngoscopy.  Dr. Elijah Birk of ENT was consulted on patient arrival. He performed bedside laryngoscopy no evidence of foreign body on his exams.  No evidence or concern for persistent globus sensation.  Per Dr. Elijah Birk, "Given this happened 5 days ago, I would expect to see redness and some asymmetry  if there were a retained foreign body in the right base of tongue.  His exam is pristine.  I would follow-up with an ENT if the symptoms persist.  I would come back immediately to the ER if the symptoms worsen or he develops a fever."  Well appearing and stable for DC.  Final Clinical Impression(s) / ED Diagnoses Final diagnoses:  Foreign body sensation in throat    Rx / DC Orders ED Discharge Orders     None         Ernie Avena, MD 08/12/21 2229

## 2021-08-12 NOTE — Consult Note (Addendum)
Reason for Consult: Foreign body sensation pharynx Referring Physician: ER attending  Jeremy Rivas is an 71 y.o. male.  HPI: This 71 year old African male was sent from an outside urgent care complaining of a 5-day history of a foreign body sensation localized to the right base of tongue after eating fish.  The sensation has not worsened or improved and remained unchanged during the 5 days.  He is eating and drinking without problems.  He has had no fever.  He reports no hemoptysis or bleeding.  The fissure was some sort of small sardine.  History reviewed. No pertinent past medical history.  History reviewed. No pertinent surgical history.  No family history on file.  Social History:  reports that he has never smoked. He has never used smokeless tobacco. He reports that he does not drink alcohol and does not use drugs.  Allergies: Not on File  Medications: I have reviewed the patient's current medications.  Results for orders placed or performed during the hospital encounter of 08/12/21 (from the past 48 hour(s))  CBC with Differential/Platelet     Status: Abnormal   Collection Time: 08/12/21  7:04 PM  Result Value Ref Range   WBC 4.1 4.0 - 10.5 K/uL   RBC 4.80 4.22 - 5.81 MIL/uL   Hemoglobin 14.8 13.0 - 17.0 g/dL   HCT 00.9 23.3 - 00.7 %   MCV 92.7 80.0 - 100.0 fL   MCH 30.8 26.0 - 34.0 pg   MCHC 33.3 30.0 - 36.0 g/dL   RDW 62.2 63.3 - 35.4 %   Platelets 176 150 - 400 K/uL   nRBC 0.0 0.0 - 0.2 %   Neutrophils Relative % 46 %   Neutro Abs 1.9 1.7 - 7.7 K/uL   Lymphocytes Relative 28 %   Lymphs Abs 1.2 0.7 - 4.0 K/uL   Monocytes Relative 11 %   Monocytes Absolute 0.4 0.1 - 1.0 K/uL   Eosinophils Relative 14 %   Eosinophils Absolute 0.6 (H) 0.0 - 0.5 K/uL   Basophils Relative 1 %   Basophils Absolute 0.0 0.0 - 0.1 K/uL   Immature Granulocytes 0 %   Abs Immature Granulocytes 0.01 0.00 - 0.07 K/uL    Comment: Performed at [facility], [address] [place], [place] [address]  Basic metabolic panel     Status: Abnormal   Collection Time: 08/12/21  7:04 PM  Result Value Ref Range   Sodium 134 (L) 135 - 145 mmol/L   Potassium 4.4 3.5 - 5.1 mmol/L   Chloride 102 98 - 111 mmol/L   CO2 23 22 - 32 mmol/L   Glucose, Bld 114 (H) 70 - 99 mg/dL    Comment: Glucose reference range applies only to samples taken after fasting for at least 8 hours.   BUN 7 (L) 8 - 23 mg/dL   Creatinine, Ser 3.89 (L) 0.61 - 1.24 mg/dL   Calcium 8.3 (L) 8.9 - 10.3 mg/dL   GFR, Estimated >37 >34 mL/min    Comment: (NOTE) Calculated using the CKD-EPI Creatinine Equation (2021)    Anion gap 9 5 - 15    Comment: Performed at [facility], [address] [place], [place] [address]    No results found.  Review of Systems Blood pressure (!) 150/98, pulse 72, temperature 98 F (36.7 C), resp. rate 20, weight 72.6 kg, SpO2 100 %.  Physical Exam  Communicates well without shortness of breath Face atraumatic doubt bony step-off Chest symmetric without use of accessory  muscles Neck without mass or adenopathy Cranial nerves II through XII intact  FFOL -there is no evidence of a foreign body.  There is no asymmetry on his exam.  There is no evidence of mucosal inflammation anywhere on his laryngoscopy.  CT scan -there is no evidence of any soft tissue swelling on the scan.  There is no evidence of a foreign body or fishbone on the scan.  There is no mass on the scan.   Assessment/Plan:  Foreign body sensation pharynx  I reviewed the CT scan films, examined the patient, and performed a bedside laryngoscopy.  There is no evidence of a foreign body on his exams.  I cannot explain his persistent globus sensation.  Given this happened 5 days ago, I would expect to see redness and some asymmetry if there were a retained foreign body in the right base of tongue.  His exam is pristine.  I would follow-up with an ENT if the symptoms persist.  I  would come back immediately to the ER if the symptoms worsen or he develops a fever.  Rejeana Brock 08/12/2021, 10:17 PM

## 2021-08-12 NOTE — ED Provider Notes (Signed)
[facility] EMERGENCY DEPT Provider Note   CSN: [id number] Arrival date & time: 08/12/21  1520     History  Chief Complaint  Patient presents with   Sore Throat    Jeremy Rivas is a 71 y.o. male.  The history is provided by the patient and a relative. The history is limited by a language barrier. A language interpreter was used.  Sore Throat   71 year old male presenting for complaints of throat discomfort.  Due to language barrier, history obtained through daughter who is at bedside.  5 days ago patient was eating fish at home when he felt a small piece of bone stuck in the back of his throat on the right side.  Since then each time he eats or drinks he can feel the same discomfort sensation.  He rates his discomfort a 7 out of 10.  He denies any vomiting no hematemesis no abdominal pain or chest pain.  No cold symptoms.  He denies fever chills  Home Medications Prior to Admission medications   Not on File      Allergies    Patient has no allergy information on record.    Review of Systems   Review of Systems  Constitutional:  Negative for fever.  HENT:  Positive for sore throat.    Physical Exam Updated Vital Signs BP (!) 141/90    Pulse 70    Temp 97.7 F (36.5 C)    Resp 18    Wt 72.6 kg    SpO2 100%  Physical Exam Vitals and nursing note reviewed.  Constitutional:      General: He is not in acute distress.    Appearance: He is well-developed.  HENT:     Head: Atraumatic.     Mouth/Throat:     Mouth: Mucous membranes are moist.  Eyes:     Conjunctiva/sclera: Conjunctivae normal.  Neck:     Vascular: No carotid bruit.  Cardiovascular:     Rate and Rhythm: Normal rate and regular rhythm.     Pulses: Normal pulses.     Heart sounds: Normal heart sounds.  Pulmonary:     Effort: Pulmonary effort is normal.     Breath sounds: Normal breath sounds. No wheezing, rhonchi or rales.  Abdominal:     Palpations: Abdomen is soft.     Tenderness:  There is no abdominal tenderness.  Musculoskeletal:     Cervical back: Normal range of motion and neck supple. No rigidity or tenderness.  Lymphadenopathy:     Cervical: No cervical adenopathy.  Skin:    Findings: No rash.  Neurological:     Mental Status: He is alert.    ED Results / Procedures / Treatments   Labs (all labs ordered are listed, but only abnormal results are displayed) Labs Reviewed  CBC WITH DIFFERENTIAL/PLATELET - Abnormal; Notable for the following components:      Result Value   Eosinophils Absolute 0.6 (*)    All other components within normal limits  BASIC METABOLIC PANEL - Abnormal; Notable for the following components:   Sodium 134 (*)    Glucose, Bld 114 (*)    BUN 7 (*)    Creatinine, Ser 0.59 (*)    Calcium 8.3 (*)    All other components within normal limits    EKG None  Radiology No results found.  CLINICAL DATA: Initial evaluation for possible foreign body, fishbone.  EXAM: CT NECK WITH CONTRAST  TECHNIQUE: Multidetector CT imaging of the neck was  performed using the standard protocol following the bolus administration of intravenous contrast.  RADIATION DOSE REDUCTION: This exam was performed according to the departmental dose-optimization program which includes automated exposure control, adjustment of the mA and/or kV according to patient size and/or use of iterative reconstruction technique.  CONTRAST: 107mL OMNIPAQUE IOHEXOL 300 MG/ML SOLN  COMPARISON: None available.  FINDINGS: Pharynx and larynx: Oral cavity within normal limits. No acute abnormality about the dentition. Oropharynx and nasopharynx within normal limits. No retropharyngeal collection or swelling. Negative epiglottis. Vallecula clear. Hypopharynx and supraglottic larynx within normal limits. Glottis normal. Subglottic airway clear. Visualized upper esophagus clear. No visible radiopaque foreign body.  Salivary glands: Salivary glands including the parotid  and submandibular glands are normal.  Thyroid: Normal.  Lymph nodes: No enlarged or pathologic adenopathy.  Vascular: Normal intravascular enhancement seen throughout the neck.  Limited intracranial: Unremarkable.  Visualized orbits: Unremarkable.  Mastoids and visualized paranasal sinuses: Visualized paranasal sinuses are largely clear. Mastoid air cells and middle ear cavities well pneumatized and free of fluid.  Skeleton: No discrete or worrisome osseous lesions. Mild spondylosis for age noted.  Upper chest: Visualized upper chest demonstrates no acute finding.  Other: None.  IMPRESSION: Negative CT of the neck. No visible radiopaque foreign body identified.   Electronically Signed By: Rise Mu M.D. On: 08/12/2021 20:48   Procedures Procedures    Medications Ordered in ED Medications  iohexol (OMNIPAQUE) 300 MG/ML solution 80 mL (80 mLs Intravenous Contrast Given 08/12/21 2014)    ED Course/ Medical Decision Making/ A&P                           Medical Decision Making Amount and/or Complexity of Data Reviewed Labs: ordered. Radiology: ordered.  Risk Prescription drug management.   BP 133/90    Pulse 70    Temp 97.7 F (36.5 C)    Resp 18    Wt 72.6 kg    SpO2 100%   7:05 PM This is a 71 year old male presenting with complaints of concerns of a fishbone stuck in the back of his throat.  Incident happened approximate 5 days ago.  He endorsed pain discomfort to the right side of his neck after swallowing a piece of fish which he felt there was a fishbone in it.  He does not endorse any difficulty breathing, and denies vomiting up any blood or having chest pain or abdominal pain.  His foreign body sensation is likely due to a fishbone.  He does not presents with symptom concern for perforation obstruction or fistula.  He is not drooling or having inability to swallow, chest discomfort.  His lungs are clear to auscultation.  Appreciate on-call  consultation from ENT specialist, Dr. Elijah Birk, who recommend obtaining neck CT with contrast for further assessment.  Care discussed with Dr. Bernette Mayers.   9:12 PM CT scan of the neck obtained and independently visualized and interpreted by me.  No evidence of fb noted.  I discussed this with ENT specialist Dr. Elijah Birk who recommend pt to come to [facility] ER to be scoped by him.  Pt will be travelling by private vehicle.  Staff were notified.          Final Clinical Impression(s) / ED Diagnoses Final diagnoses:  Foreign body sensation in throat    Rx / DC Orders ED Discharge Orders     None         Fayrene Helper, PA-C 08/12/21 2114  Pollyann Savoy, MD 08/13/21 [phone]

## 2021-08-12 NOTE — Discharge Instructions (Addendum)
You have been evaluated for possible fish bone in throat.  ENT performed laryngoscopy bedside and there was a very reassuring exam.

## 2021-08-14 ENCOUNTER — Encounter (HOSPITAL_COMMUNITY): Payer: Self-pay | Admitting: Emergency Medicine

## 2022-01-10 IMAGING — CT CT ABD-PELV W/ CM
2 of 5 series · 16 of 46 positions shown, 18 images · IV contrast (omnipaque)
Comparison: None.

CLINICAL DATA: Abdominal pain

EXAM:
CT ABDOMEN AND PELVIS WITH CONTRAST
TECHNIQUE: Multidetector CT imaging of the abdomen and pelvis was performed
using the standard protocol following bolus administration of
intravenous contrast.
CONTRAST:  100mL OMNIPAQUE IOHEXOL 300 MG/ML  SOLN

[Series 3: abdomen 5.0 · axial · 0.79mm/px · z∈[-444,-8]mm · 13 of 101 slices shown, 15 images]
[im 7/101  soft-tissue]
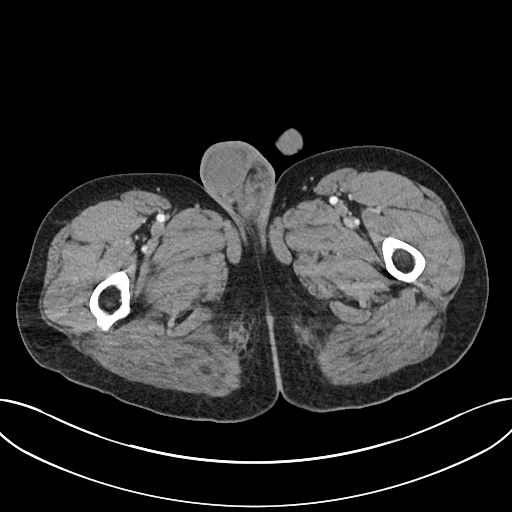
[im 7/101  bone]
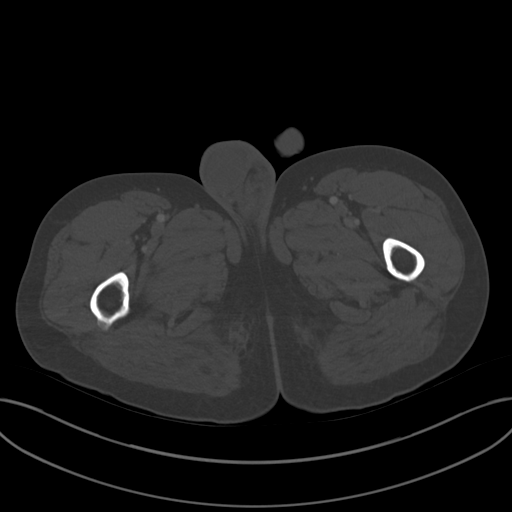
[im 13/101  soft-tissue]
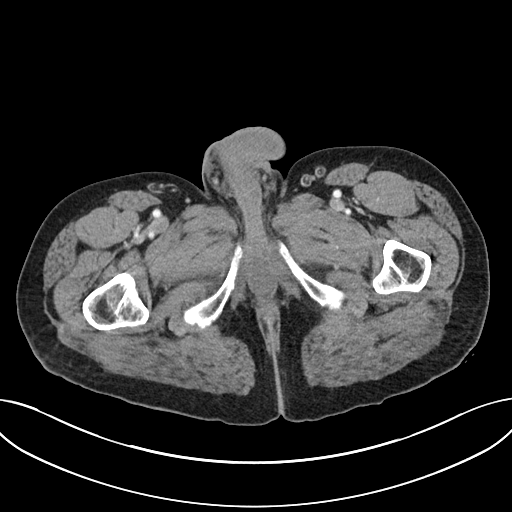
[im 19/101  soft-tissue]
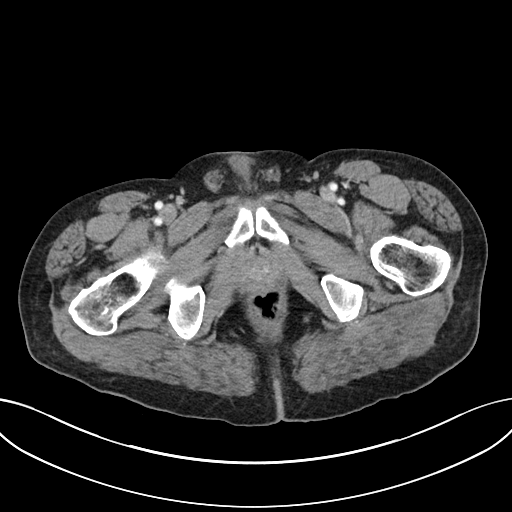
[im 32/101  soft-tissue]
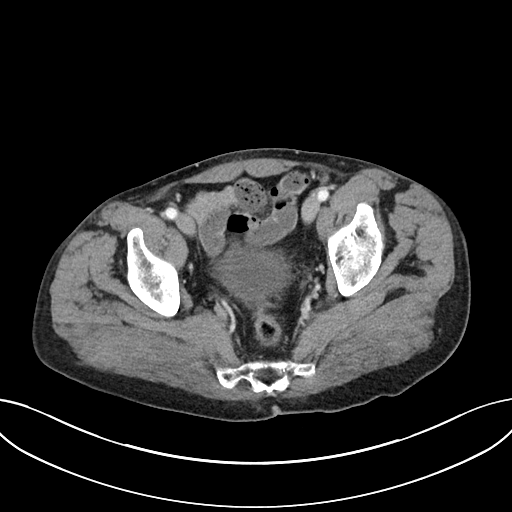
[im 38/101  soft-tissue]
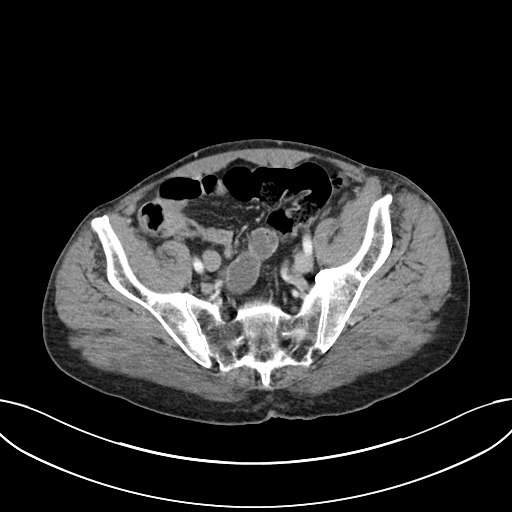
[im 44/101  soft-tissue]
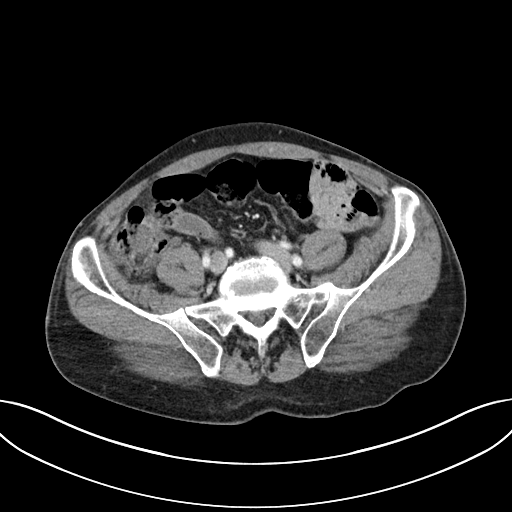
[im 51/101  soft-tissue]
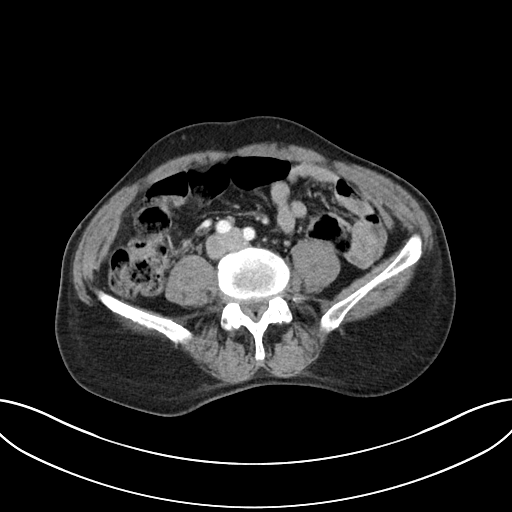
[im 57/101  soft-tissue]
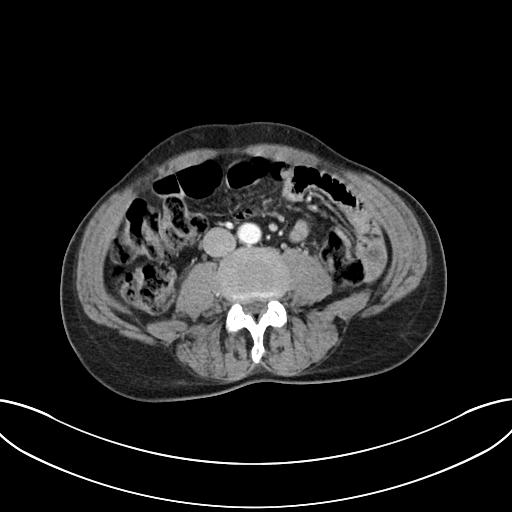
[im 63/101  soft-tissue]
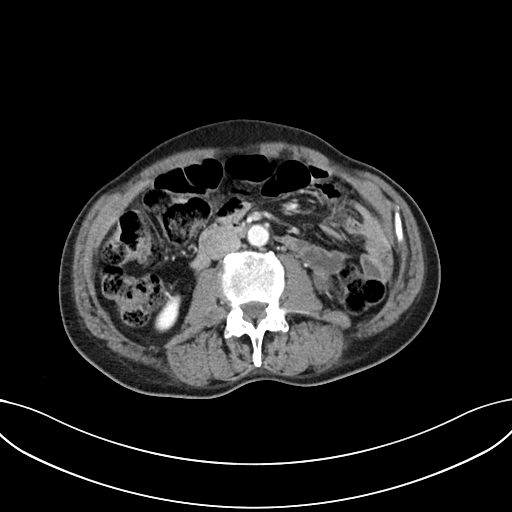
[im 63/101  bone]
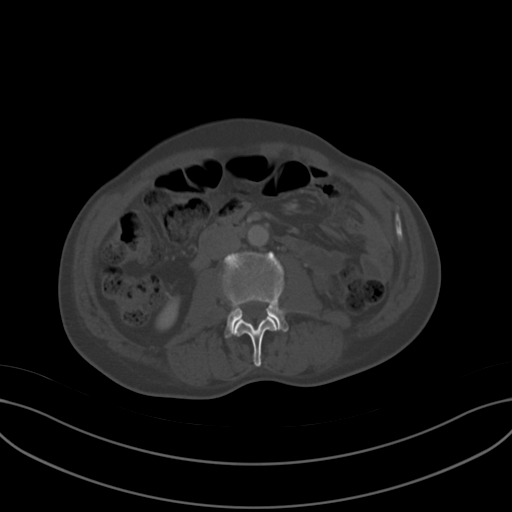
[im 69/101  soft-tissue]
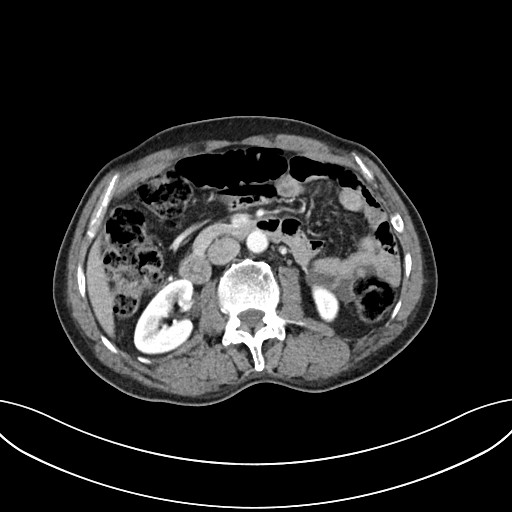
[im 82/101  soft-tissue]
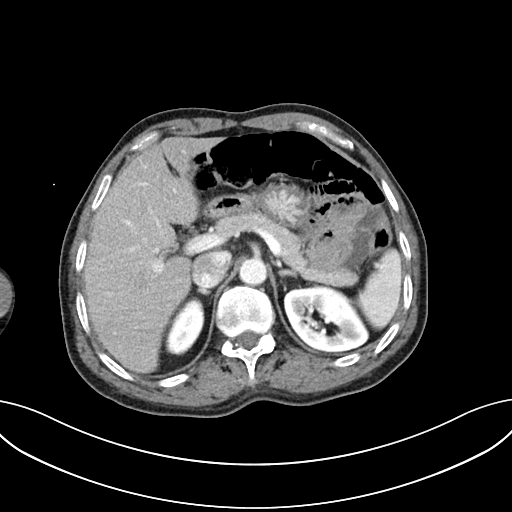
[im 88/101  soft-tissue]
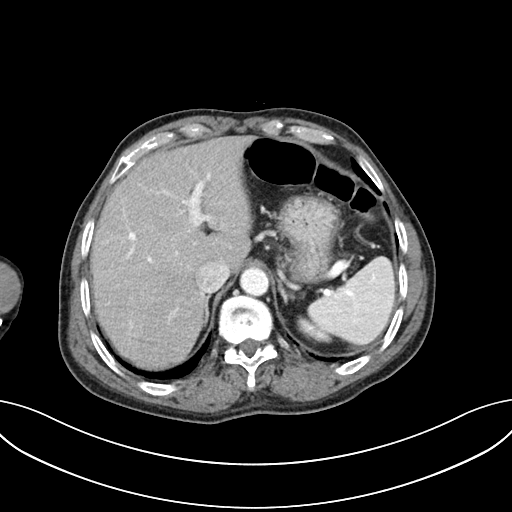
[im 94/101  soft-tissue]
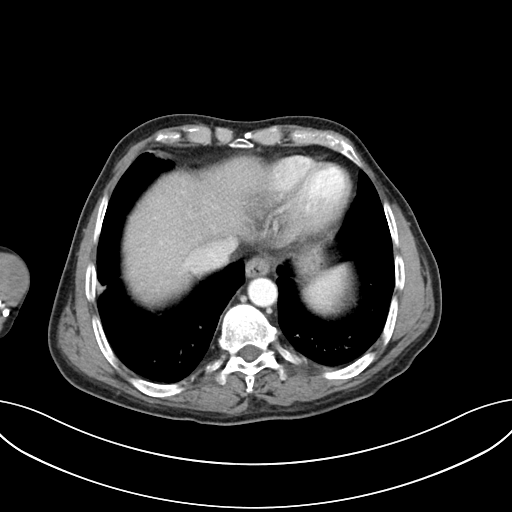

[Series 6: abdomen 3.0 mpr cor · coronal · 0.65mm/px · 3 of 86 slices shown]
[im 29/86  soft-tissue]
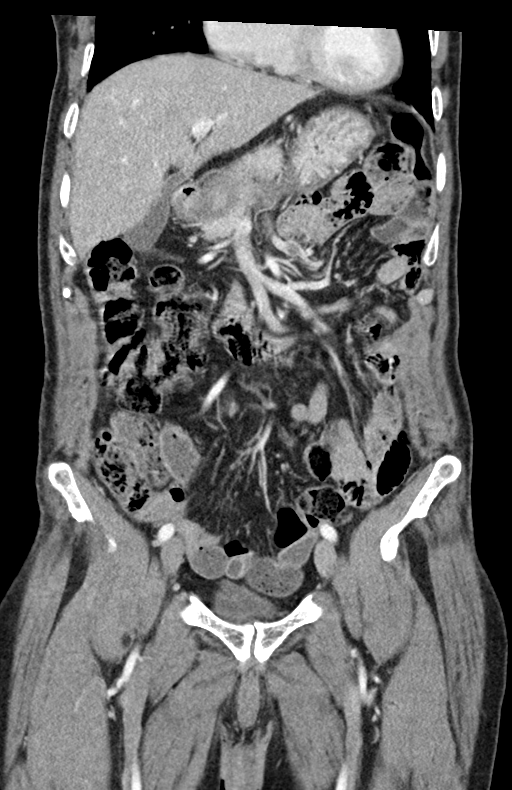
[im 38/86  soft-tissue]
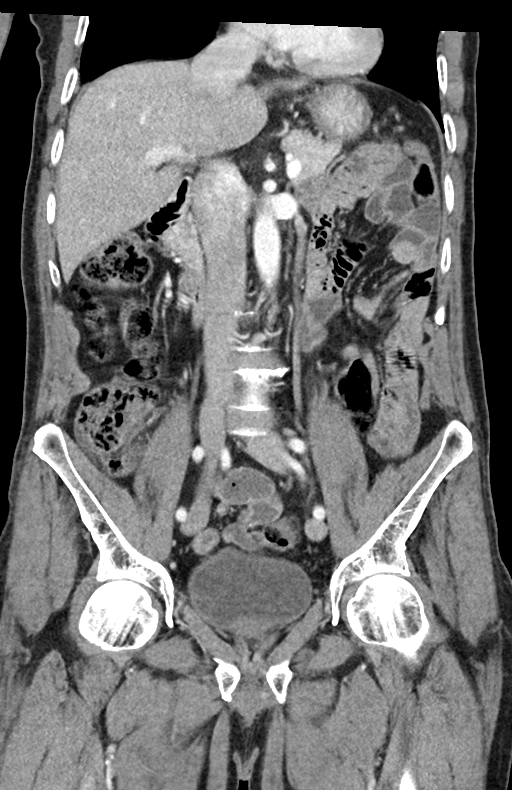
[im 48/86  soft-tissue]
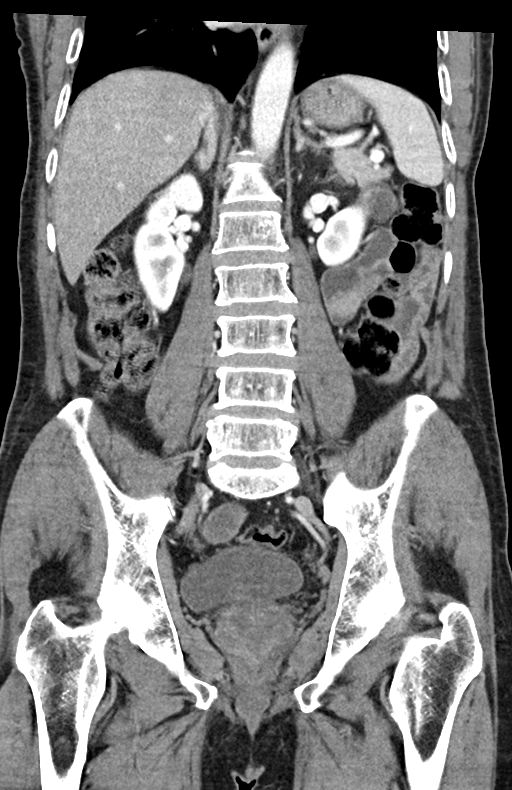

[16 of 46 positions shown; findings below may reference images not displayed]

FINDINGS: Lower chest: Dependent atelectasis in the bilateral lower lobes.

Hepatobiliary: Liver is within normal limits.

Gallbladder is unremarkable. No intrahepatic or extrahepatic ductal
dilatation.

Pancreas: Within normal limits.

Spleen: Within normal limits.

Adrenals/Urinary Tract: Adrenal glands are within normal limits.

Kidneys are within normal limits.  No hydronephrosis.

Bladder is within normal limits.

Stomach/Bowel: Stomach is within normal limits.

No evidence of bowel obstruction.

Normal appendix (series 3/image 62).

No colonic wall thickening or inflammatory changes.

Vascular/Lymphatic: No evidence of abdominal aortic aneurysm.

No suspicious abdominopelvic lymphadenopathy.

Reproductive: Mild prostatomegaly.

Other: No abdominopelvic ascites.

Musculoskeletal: Mild degenerative changes at L1-2.
IMPRESSION: Negative CT abdomen/pelvis.

## 2022-08-13 IMAGING — DX DG NECK SOFT TISSUE
2 series · 2 of 2 positions shown · non-contrast
Comparison: None.

CLINICAL DATA: Throat pain.

EXAM:
NECK SOFT TISSUES - 1+ VIEW

[neck lat]
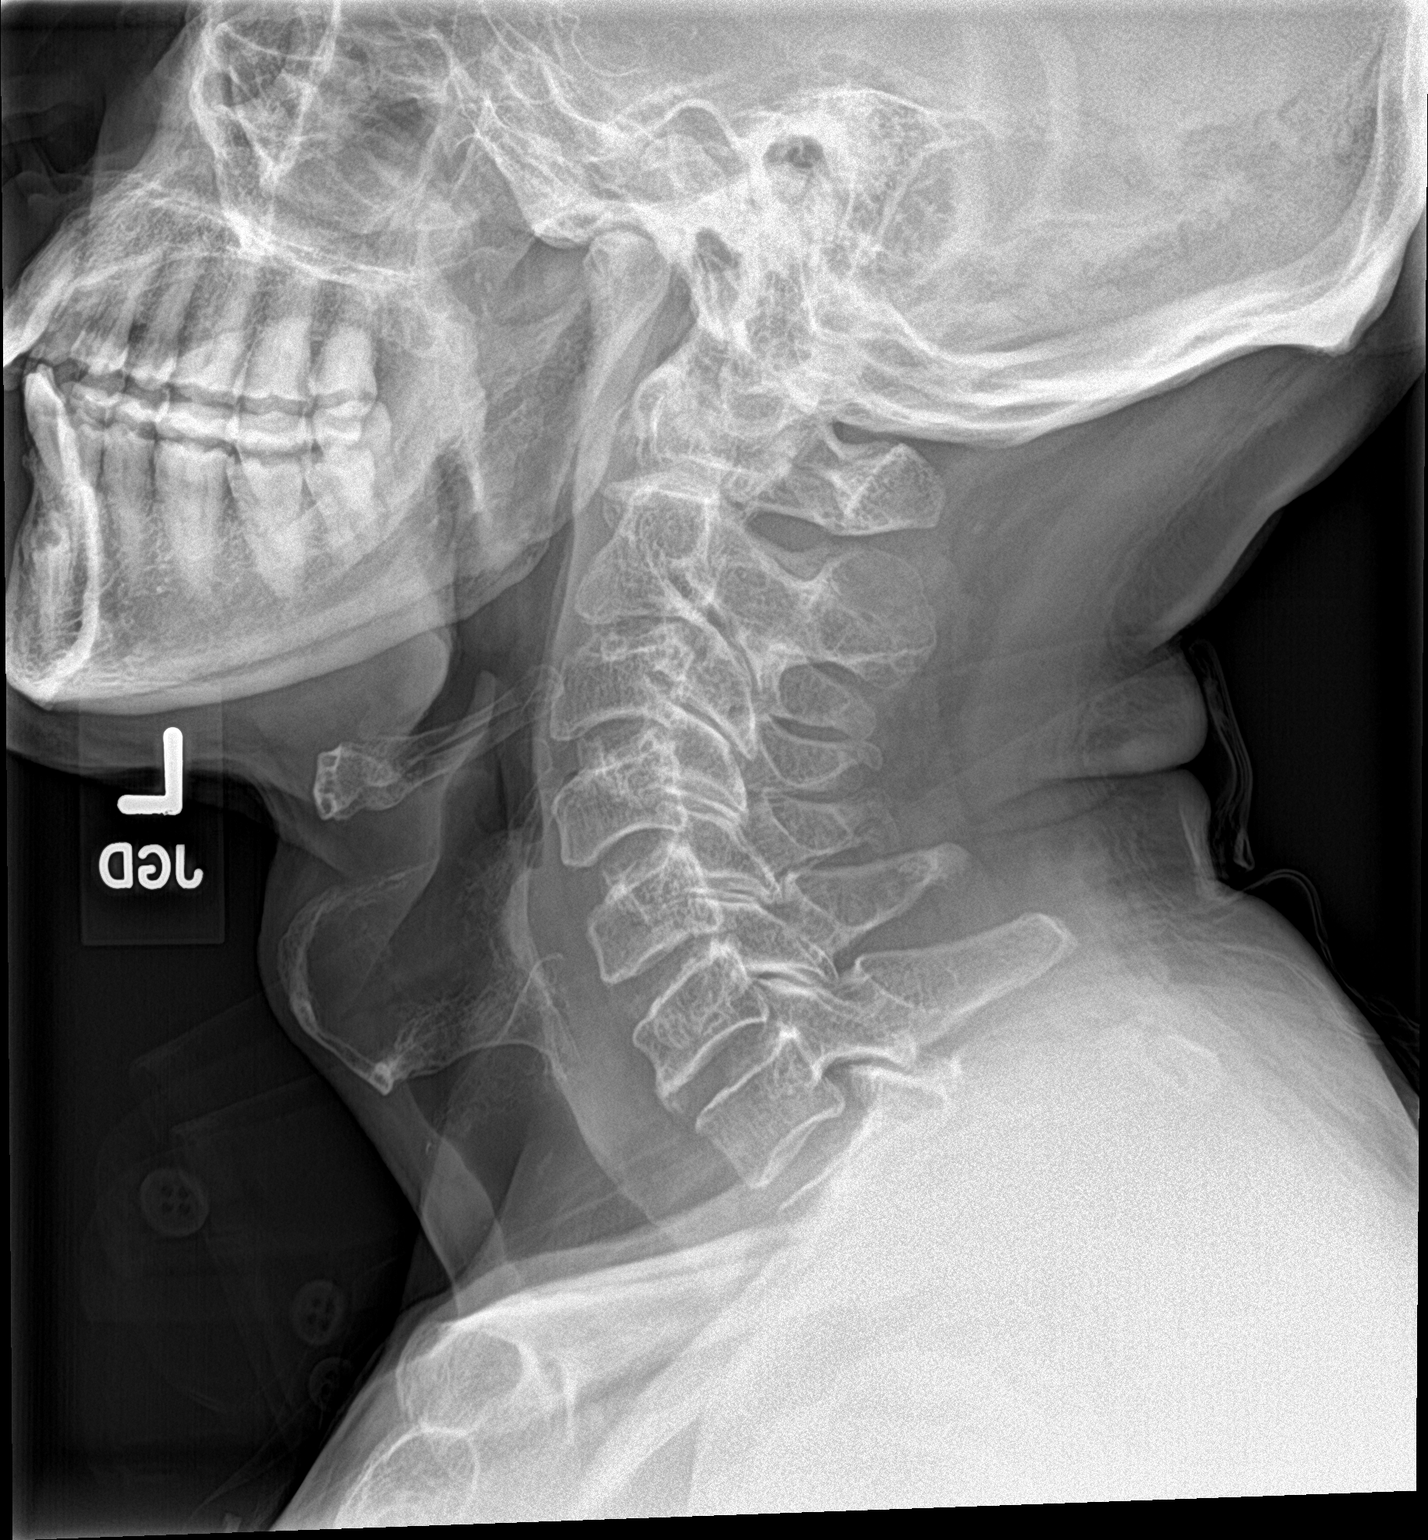

[neck ap]
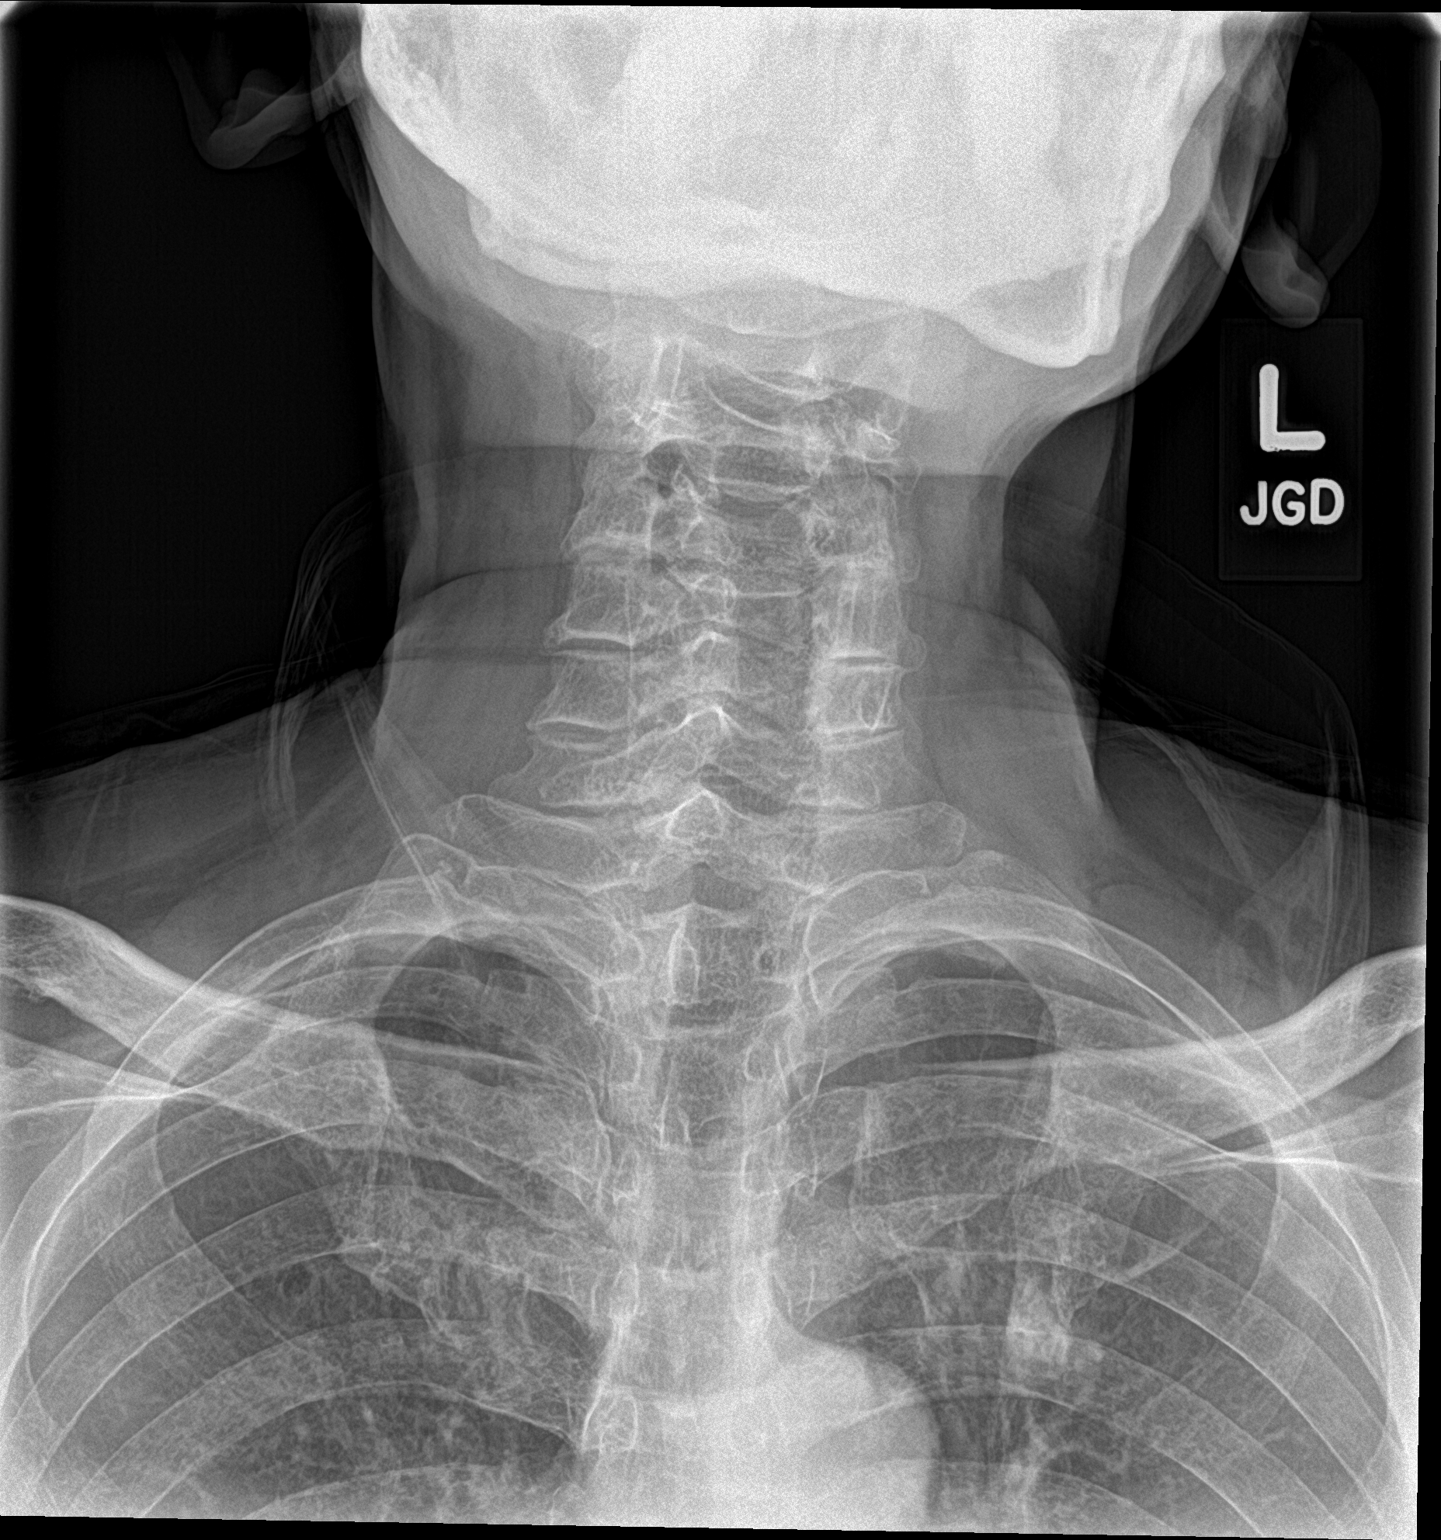

[2 of 2 positions shown; findings below may reference images not displayed]

FINDINGS: There is no evidence of retropharyngeal soft tissue swelling or
epiglottic enlargement. The cervical airway is unremarkable and no
radio-opaque foreign body identified.
IMPRESSION: Negative.

## 2023-01-29 ENCOUNTER — Emergency Department (HOSPITAL_COMMUNITY): Payer: BC Managed Care – PPO

## 2023-01-29 ENCOUNTER — Other Ambulatory Visit: Payer: Self-pay

## 2023-01-29 ENCOUNTER — Emergency Department (HOSPITAL_COMMUNITY)
Admission: EM | Admit: 2023-01-29 | Discharge: 2023-01-29 | Disposition: A | Discharge status: Home / Self Care | Payer: Self-pay | Attending: Emergency Medicine | Admitting: Emergency Medicine

## 2023-01-29 ENCOUNTER — Encounter (HOSPITAL_COMMUNITY): Payer: Self-pay | Admitting: Emergency Medicine

## 2023-01-29 DIAGNOSIS — W108XXA Fall (on) (from) other stairs and steps, initial encounter: Secondary | ICD-10-CM | POA: Insufficient documentation

## 2023-01-29 DIAGNOSIS — Z23 Encounter for immunization: Secondary | ICD-10-CM | POA: Insufficient documentation

## 2023-01-29 DIAGNOSIS — S0101XA Laceration without foreign body of scalp, initial encounter: Secondary | ICD-10-CM | POA: Insufficient documentation

## 2023-01-29 DIAGNOSIS — S0990XA Unspecified injury of head, initial encounter: Secondary | ICD-10-CM

## 2023-01-29 DIAGNOSIS — W19XXXA Unspecified fall, initial encounter: Secondary | ICD-10-CM

## 2023-01-29 MED ORDER — ACETAMINOPHEN 325 MG PO TABS
ORAL_TABLET | ORAL | Status: AC
  Administered 2023-01-29: 650 mg via ORAL
  Filled 2023-01-29: qty 2

## 2023-01-29 MED ORDER — ACETAMINOPHEN 325 MG PO TABS: 650.0000 mg | ORAL_TABLET | Freq: Once | ORAL | Status: AC | PRN

## 2023-01-29 MED ORDER — TETANUS-DIPHTH-ACELL PERTUSSIS 5-2.5-18.5 LF-MCG/0.5 IM SUSY
0.5000 mL | PREFILLED_SYRINGE | Freq: Once | INTRAMUSCULAR | Status: AC
  Administered 2023-01-29: 0.5 mL via INTRAMUSCULAR
  Filled 2023-01-29: qty 0.5

## 2023-01-29 NOTE — ED Notes (Signed)
Discharge instructions reviewed with pt/ family member. Interpreter declined.   Follow up encouraged for staple removal in 10 days.   Work note provided per pt request.   Opportunity provided for questions and concerns.

## 2023-01-29 NOTE — ED Notes (Signed)
EDP cleaned & closed the lac to top/Rt side of pts head with 3 staples, he tol this well.

## 2023-01-29 NOTE — Discharge Instructions (Signed)
Please follow-up with your primary care doctor in 10 days for your staples to be removed.  They can get wet, but if you develop any redness, swelling in this area please return to the ER.  Additionally the your head CT was reassuring, there is no evidence of head bleed.  However if the patient starts becoming confused, having intractable nausea, vomiting, or severe headache please return to the ER.

## 2023-01-29 NOTE — ED Notes (Signed)
Pt's wound cleaned with saline, applied pressure, instructed pt to hold pressure, tylenol administered for pain.

## 2023-01-29 NOTE — ED Triage Notes (Signed)
Pt tripped going down steps and hit his head.  Lac noted. Bleeding controlled. No thinners.  No pain anywhere but where the laceration is.

## 2023-01-29 NOTE — ED Notes (Signed)
Pt family upset with wait.  Raising voice to staff.  Unable to obtain temp d/t pt being uncooperative.  Pt refused C Collar.

## 2023-01-29 NOTE — ED Notes (Signed)
Pt's family out of room and yelling at this RN "tell me what medicine to get and I will go get it he has been bleeding for 4 hours"  Explained to him that a room is being cleaned at this moment for his family member and that he probably needs sutures or staples to stop the bleeding, not any form of medication.  Security called to stand by as family member was becoming more irate.

## 2023-01-29 NOTE — ED Provider Notes (Signed)
EMERGENCY DEPARTMENT AT [facility] Provider Note   CSN: [id number] Arrival date & time: 01/29/23  1727     History  Chief Complaint  Patient presents with   Fall    Jeremy Rivas is a 72 y.o. male, no pertinent past medical history, who presents to the ED secondary to mechanical fall that occurred earlier today.  He states that he was walking on the stairs, and when he tripped, and fell and hit his head.  Denies any loss of consciousness, is not on any blood thinners.  Denies any headache, nausea, vomiting, or abnormal behavior.  Unknown last tetanus.  States this has been bleeding, but otherwise denies any pain.     Home Medications Prior to Admission medications   Medication Sig Start Date End Date Taking? Authorizing Provider  carbamide peroxide (DEBROX) 6.5 % OTIC solution Place 5 drops into both ears 2 (two) times daily. 06/11/20   Littie Deeds, MD  dicyclomine (BENTYL) 20 MG tablet Take 1 tablet (20 mg total) by mouth 2 (two) times daily. 01/09/21   Mesner, Barbara Cower, MD      Allergies    Patient has no known allergies.    Review of Systems   Review of Systems  Skin:  Positive for wound.  Neurological:  Negative for headaches.    Physical Exam Updated Vital Signs BP 112/73 (BP Location: Right Arm)   Pulse 92   Temp 98.1 F (36.7 C) (Oral)   Resp 19   SpO2 99%  Physical Exam Vitals and nursing note reviewed.  Constitutional:      General: He is not in acute distress.    Appearance: He is well-developed.  HENT:     Head: Normocephalic.     Comments: +2 cm laceration to scalp Eyes:     Conjunctiva/sclera: Conjunctivae normal.  Cardiovascular:     Rate and Rhythm: Normal rate and regular rhythm.     Heart sounds: No murmur heard. Pulmonary:     Effort: Pulmonary effort is normal. No respiratory distress.     Breath sounds: Normal breath sounds.  Abdominal:     Palpations: Abdomen is soft.     Tenderness: There is no abdominal  tenderness.  Musculoskeletal:        General: No swelling.     Cervical back: Neck supple.  Skin:    General: Skin is warm and dry.     Capillary Refill: Capillary refill takes less than 2 seconds.  Neurological:     Mental Status: He is alert.  Psychiatric:        Mood and Affect: Mood normal.     ED Results / Procedures / Treatments   Labs (all labs ordered are listed, but only abnormal results are displayed) Labs Reviewed - No data to display  EKG None  Radiology CT Head Wo Contrast  Result Date: 01/29/2023 CLINICAL DATA:  Status post trauma. EXAM: CT HEAD WITHOUT CONTRAST TECHNIQUE: Contiguous axial images were obtained from the base of the skull through the vertex without intravenous contrast. RADIATION DOSE REDUCTION: This exam was performed according to the departmental dose-optimization program which includes automated exposure control, adjustment of the mA and/or kV according to patient size and/or use of iterative reconstruction technique. COMPARISON:  None Available. FINDINGS: Brain: There is mild cerebral atrophy with widening of the extra-axial spaces and ventricular dilatation. There are areas of decreased attenuation within the white matter tracts of the supratentorial brain, consistent with microvascular disease changes. Vascular: No hyperdense  vessel or unexpected calcification. Skull: Normal. Negative for fracture or focal lesion. Sinuses/Orbits: No acute finding. Other: Surgical staples are seen within the right frontoparietal scalp soft tissues, near the vertex. IMPRESSION: 1. Generalized cerebral atrophy with chronic white matter Hadasah Brugger vessel ischemic changes. 2. No acute intracranial abnormality. Electronically Signed   By: Aram Candela M.D.   On: 01/29/2023 19:27   CT Cervical Spine Wo Contrast  Result Date: 01/29/2023 CLINICAL DATA:  Status post trauma. EXAM: CT CERVICAL SPINE WITHOUT CONTRAST TECHNIQUE: Multidetector CT imaging of the cervical spine was  performed without intravenous contrast. Multiplanar CT image reconstructions were also generated. RADIATION DOSE REDUCTION: This exam was performed according to the departmental dose-optimization program which includes automated exposure control, adjustment of the mA and/or kV according to patient size and/or use of iterative reconstruction technique. COMPARISON:  None Available. FINDINGS: Alignment: Normal. Skull base and vertebrae: No acute fracture. No primary bone lesion or focal pathologic process. Chronic and degenerative changes are seen involving the body and tip of the dens, as well as the adjacent portion of the anterior arch of C1. A chronic deformity is also seen involving the spinous process of the T1 vertebral body. Soft tissues and spinal canal: No prevertebral fluid or swelling. No visible canal hematoma. Disc levels: Moderate severity multilevel endplate sclerosis is seen throughout the cervical spine. This is most prominent at the levels of C5-C6 and C6-C7. Mild anterior osteophyte formation is also seen at C6-C7. There is marked severity narrowing of the anterior atlantoaxial articulation. Mild multilevel intervertebral disc space narrowing is seen. Bilateral moderate severity multilevel facet joint hypertrophy is noted. Upper chest: Negative. Other: A 12 mm diameter thyroid nodule is seen within the left lobe of the thyroid gland. IMPRESSION: 1. Moderate severity multilevel degenerative changes, as described above. 2. No acute cervical spine fracture or subluxation. Electronically Signed   By: Aram Candela M.D.   On: 01/29/2023 19:24    Procedures .Marland KitchenLaceration Repair  Date/Time: 01/29/2023 6:59 PM  Performed by: Pete Pelt, PA Authorized by: Pete Pelt, PA   Consent:    Consent obtained:  Verbal   Consent given by:  Patient   Risks, benefits, and alternatives were discussed: yes     Risks discussed:  Infection, need for additional repair, poor wound healing, nerve  damage, poor cosmetic result, pain, retained foreign body, tendon damage and vascular damage   Alternatives discussed:  No treatment Universal protocol:    Patient identity confirmed:  Verbally with patient Anesthesia:    Anesthesia method:  None Laceration details:    Location:  Scalp   Scalp location:  R parietal   Length (cm):  2 Treatment:    Area cleansed with:  Chlorhexidine   Amount of cleaning:  Standard   Irrigation solution:  Sterile saline   Irrigation method:  Syringe Skin repair:    Repair method:  Staples   Number of staples:  3 Approximation:    Approximation:  Close     Medications Ordered in ED Medications  acetaminophen (TYLENOL) tablet 650 mg (650 mg Oral Given 01/29/23 1811)  Tdap (BOOSTRIX) injection 0.5 mL (0.5 mLs Intramuscular Given 01/29/23 1854)    ED Course/ Medical Decision Making/ A&P                             Medical Decision Making Patient is a 72 year old male, no pertinent past medical history, here for fall after tripping on the  stairs today.  He denies any chest pain, shortness of breath, before the fall.  Did not get dizzy, states he just tripped over the stairs.  Only pain is to his head, where the laceration is.  Is not on any blood thinners no loss of consciousness.  Will obtain head CT as he is 65+, and cervical spine, for further evaluation.  I stapled his head, with 3 staples and instructed them that they will need to return in 10 days for staple removal.  Return precautions emphasized  Amount and/or Complexity of Data Reviewed Radiology: ordered.    Details: Head CT, neck CT, shows no acute findings of any kind of bleed or fracture. Discussion of management or test interpretation with external provider(s): Discussed with patient, head CT and neck CT are reassuring.  Staples present, will need to return in 10 days for staple removal.  We discussed return precautions they voiced understanding.  Discharged home with strict return  precautions.  Risk OTC drugs. Prescription drug management.    Final Clinical Impression(s) / ED Diagnoses Final diagnoses:  Laceration of scalp, initial encounter  Injury of head, initial encounter  Fall, initial encounter    Rx / DC Orders ED Discharge Orders     None         Daishawn Lauf, Harley [place], PA 01/29/23 2011    Tanda Rockers A, DO 01/30/23 [phone]

## 2023-02-09 ENCOUNTER — Ambulatory Visit (INDEPENDENT_AMBULATORY_CARE_PROVIDER_SITE_OTHER): Payer: Self-pay | Admitting: Student

## 2023-02-09 VITALS — BP 126/73 | HR 66 | Ht 70.0 in | Wt 155.0 lb

## 2023-02-09 DIAGNOSIS — S0101XD Laceration without foreign body of scalp, subsequent encounter: Secondary | ICD-10-CM

## 2023-02-09 NOTE — Progress Notes (Signed)
    SUBJECTIVE:   CHIEF COMPLAINT / HPI: Staple removal  In person Kinyarwanda interpreter present throughout entire encounter  Patient had laceration of his scalp on 7/27 where he states that he was getting up and hit his head on a cabinet and this caused the scalp laceration.  He received 3 staples in the ED.  CT head/neck at that time was negative.  Denies any drainage from the site.  Denies any fevers or pain.  PERTINENT  PMH / PSH: Reviewed  OBJECTIVE:   BP 126/73   Pulse 66   Ht 5\' 10"  (1.778 m)   Wt 155 lb (70.3 kg)   SpO2 99%   BMI 22.24 kg/m   General: Well appearing, NAD, awake, alert, responsive to questions Head: Normocephalic 3 staples in place, wound well approximated with no dehiscence, erythema or drainage  Before procedure:   After procedure:    ASSESSMENT/PLAN:   Scalp laceration, subsequent encounter S/p scalp laceration 7/27.  Was told to have staples removed 10 days after procedure.  He is now 12 days out.  No infectious signs and appears well-approximated on examination and well-healed.  Verbal consent obtained for staple removal.  Cleaned site with alcohol swab.  3 staples were removed easily and patient tolerated this very well.  No bleeding from the site.  Discussed with patient can wash normally.  ED/return precautions discussed and provided in AVS.   Levin Erp, MD [facility]

## 2023-02-09 NOTE — Patient Instructions (Addendum)
It was great to see you! Thank you for allowing me to participate in your care!   Our plans for today:  -We removed your staples today, please return to care if you had any drainage, fevers or increased pain from the site [facility] your hair as normal  Take care and seek immediate care sooner if you develop any concerns.  Levin Erp, MD   Karel Jarvis Lynnell Dike cyane [place]! Urakoze Ruthy Dick Nettie Elm uruhare mukwitaho!  Moldova zacu z'uyu munsi: -Twakuyeho ibintu byingenzi uyumunsi, nyamuneka subira kubitaho niba [place], umuriro cyangwa ububabare bwiyongereye kurubuga -Koza umusatsi wawe nkuko bisanzwe  Witondere kandi ushakishe ubuvuzi bwihuse niba ufite ibibazo. Levin Erp, MD

## 2023-10-07 ENCOUNTER — Ambulatory Visit: Payer: Self-pay | Admitting: Family Medicine

## 2023-10-09 NOTE — Patient Instructions (Incomplete)
 It was great to see you! Thank you for allowing me to participate in your care!  I recommend that you always bring your medications to each appointment as this makes it easy to ensure we are on the correct medications and helps [place] not miss when refills are needed.  Our plans for today:  - Staples Removed from head wound, appears closed and no signs of infection  - Thyroid Nodule We are checking some blood work, and getting imaging of your thyroid.  Someone from our clinic will call to get your Thyroid Ultrasound scheduled at the location below.    [facility]   [address]   [phone]  We are checking some labs today, I will call you if they are abnormal will send you a MyChart message or a letter if they are normal.  If you do not hear about your labs in the next 2 weeks please let us know.  Take care and seek immediate care sooner if you develop any concerns.   Dr. Bess Kinds, MD [facility]     Byari byiza cyane [place]! Urakoze Ruthy Dick Nettie Elm uruhare mukwitaho!  [place] ko buri gihe uzana imiti yawe kuri buri gahunda kuko ibi byoroshe kwemeza ko turi kumiti ikwiye kandi [place] mugihe cyo [place] bikenewe.  Laverle Patter zacu z'uyu munsi:  - Staples Yakuwe  mu gikomere cyo mu mutwe, igaragara ifunze kandi nta kimenyetso cyanduye  - Indwara ya Thyroid Turimo kugenzura imirimo yamaraso, no kubona amashusho ya tiroyide.  Umuntu wo mu ivuriro ryacu azahamagara kugirango abone Ultrasound ya Thyroid iteganijwe ahakurikira.     [facility] [place]   [address] [place]   [phone]  Turimo kugenzura laboratoire uyu munsi, nzaguhamagara niba bidasanzwe izaguhereza ubutumwa bwa MyChart cyangwa ibaruwa niba ari ibisanzwe.  Niba utumva ibya laboratoire yawe mubyumweru 2 biri imbere nyamuneka tubitumenyeshe.  Witondere kandi ushakishe ubuvuzi bwihuse niba ufite ibibazo.   Dr. Bess Kinds, MD [place] bwumuryango

## 2023-10-09 NOTE — Progress Notes (Unsigned)
  SUBJECTIVE:   CHIEF COMPLAINT / HPI:   F/u Fall -GLF while leaning backwards > lead lac > stappled and TDAP, CT Head/Neck negative for fx / malaginment -12 mm thyroid nodule left lobe  Today: Reports feeling well, no HA, no pus or drainage, or fever. Would like staples out and feels no pain over lac.   Thyroid nodule Noted on CT Scan, left lobe, 12 mm. No hot or cold sensitivity, no changes in weight, no change in hair or sleep.   PERTINENT  PMH / PSH:    OBJECTIVE:  BP (!) 142/77   Pulse (!) 101   Ht 5\' 10"  (1.778 m)   Wt 152 lb 6.4 oz (69.1 kg)   SpO2 99%   BMI 21.87 kg/m  Physical Exam Neck:     Thyroid: Thyroid mass present. No thyromegaly or thyroid tenderness.  Musculoskeletal:     Cervical back: Normal range of motion. No edema or erythema.  Lymphadenopathy:     Cervical: No cervical adenopathy.      ASSESSMENT/PLAN:   Assessment & Plan Thyroid nodule Patient comes in for stitch removal from laceration on hand, had received CT imaging of neck that showed thyroid nodule.  Patient has 12 mm thyroid nodule located on the left thyroid lobe.  Will evaluate with TSH and thyroid ultrasound. - TSH - Thyroid ultrasound Removal of staples Patient comes in with scalp lack, that was stapled, and ED, out of town.  Patient laceration closed, healed, nontender, no drainage.  Patient reports no systemic symptoms or fevers.  Staples were removed and tolerated well. - Follow-up as needed No follow-ups on file. Bess Kinds, MD 10/10/2023, 11:29 AM PGY-3, [facility]

## 2023-10-10 ENCOUNTER — Ambulatory Visit (INDEPENDENT_AMBULATORY_CARE_PROVIDER_SITE_OTHER): Payer: Self-pay | Admitting: Student

## 2023-10-10 VITALS — BP 121/75 | HR 99 | Ht 70.0 in | Wt 152.4 lb

## 2023-10-10 DIAGNOSIS — Z4802 Encounter for removal of sutures: Secondary | ICD-10-CM | POA: Insufficient documentation

## 2023-10-10 DIAGNOSIS — E041 Nontoxic single thyroid nodule: Secondary | ICD-10-CM | POA: Diagnosis not present

## 2023-10-10 NOTE — Assessment & Plan Note (Addendum)
 Patient comes in for stitch removal from laceration on hand, had received CT imaging of neck that showed thyroid nodule.  Patient has 12 mm thyroid nodule located on the left thyroid lobe.  Will evaluate with TSH and thyroid ultrasound. - TSH - Thyroid ultrasound

## 2023-10-10 NOTE — Assessment & Plan Note (Signed)
 Patient comes in with scalp lack, that was stapled, and ED, out of town.  Patient laceration closed, healed, nontender, no drainage.  Patient reports no systemic symptoms or fevers.  Staples were removed and tolerated well. - Follow-up as needed

## 2023-10-17 ENCOUNTER — Ambulatory Visit (HOSPITAL_COMMUNITY): Admission: RE | Admit: 2023-10-17 | Source: Ambulatory Visit

## 2024-05-29 ENCOUNTER — Other Ambulatory Visit (HOSPITAL_COMMUNITY): Payer: Self-pay

## 2024-05-29 ENCOUNTER — Ambulatory Visit (INDEPENDENT_AMBULATORY_CARE_PROVIDER_SITE_OTHER)

## 2024-05-29 VITALS — BP 100/60 | HR 96 | Wt 145.0 lb

## 2024-05-29 DIAGNOSIS — R634 Abnormal weight loss: Secondary | ICD-10-CM | POA: Diagnosis not present

## 2024-05-29 DIAGNOSIS — R1024 Suprapubic pain: Secondary | ICD-10-CM | POA: Diagnosis not present

## 2024-05-29 DIAGNOSIS — R1013 Epigastric pain: Secondary | ICD-10-CM

## 2024-05-29 DIAGNOSIS — Z87898 Personal history of other specified conditions: Secondary | ICD-10-CM

## 2024-05-29 DIAGNOSIS — Z0289 Encounter for other administrative examinations: Secondary | ICD-10-CM

## 2024-05-29 LAB — POCT UA - MICROSCOPIC ONLY: WBC, Ur, HPF, POC: NONE SEEN (ref 0–5)

## 2024-05-29 LAB — POCT GLYCOSYLATED HEMOGLOBIN (HGB A1C): Hemoglobin A1C: 5.1 % (ref 4.0–5.6)

## 2024-05-29 LAB — POCT URINALYSIS DIP (MANUAL ENTRY)
Glucose, UA: NEGATIVE mg/dL
Leukocytes, UA: NEGATIVE
Nitrite, UA: NEGATIVE
Protein Ur, POC: 30 mg/dL — AB
Spec Grav, UA: 1.015 (ref 1.010–1.025)
Urobilinogen, UA: 1 U/dL
pH, UA: 5.5 (ref 5.0–8.0)

## 2024-05-29 MED ORDER — OMEPRAZOLE 20 MG PO CPDR
20.0000 mg | DELAYED_RELEASE_CAPSULE | Freq: Every day | ORAL | 3 refills | Status: AC
  Filled 2024-05-29: qty 30, 30d supply, fill #0

## 2024-05-29 NOTE — Progress Notes (Signed)
    SUBJECTIVE:   CHIEF COMPLAINT / HPI: Abdominal pain  Jeremy Rivas presents today for 1 month of abdominal pain.  He states the pain is in his lower and upper abdomen.  It also wraps around his ribs.  He states that he has had decreased appetite during this time and has not been able to eat much.  He also states that he is not drinking as much is normal.  He states that he is throwing up roughly 1 time a day due to this.  Denies any blood or coffee-ground emesis.  States he is throwing up yellow.  He rates this pain as a 10 out of 10.  He states it never gets better goes away.  He cannot identify anything that makes the pain better or worse.  He feels a sensation of fullness in his stomach like something is in it.  He denies any diarrhea, states he has been constipated with hard stools.  Denies any blood or dark black tarry stools.  Patient denies any cigarette use or prior cigarette use.  Patient states he does drink alcohol, states he sometimes will drink a beer after work.  He also states that sometimes on the weekend he will drink liquor.  Patient denies any fevers during this time, but does endorse some night sweats.  He has lost weight since he was last seen in our clinic in April.  Patient is also here with his son today who states that the patient currently does not have insurance.  His prior insurance expired, they filled out the application for Medicaid last week.  PERTINENT  PMH / PSH: Refugee patient, language barrier, prior Strongyloides infection  OBJECTIVE:   BP 100/60   Pulse 96   Wt 145 lb (65.8 kg)   SpO2 98%   BMI 20.81 kg/m   General: Thin older gentleman, NAD Throat: Moist mucous membranes, nonerythematous, nonedematous Neck: No lymphadenopathy Cardiovascular: RRR, no M/R/G, capillary refill less than 2 seconds Respiratory: CTAB, normal work of breathing on room air Abdomen: Soft, nondistended, tender to palpation in suprapubic region MSK: No point tenderness  of cervical, thoracic, or lumbar spine, tender to palpation over left sided lateral ribs under arm  ASSESSMENT/PLAN:   Assessment & Plan Suprapubic pain Possibly due to a UTI, cystitis picture.  Could also be due to a prostatitis. - Urine dipstick with microscopy and urine culture ordered today Epigastric pain Epigastric pain radiates laterally to left ribs.  Prior prescription for Bentyl  in 2022. - H. pylori breath test today - Prescribed omeprazole  20 mg Unintentional weight loss History of multiple pulmonary nodules Lost 7 pounds since April 2025.  Concern for cancer given weight loss, reported night sweats, and prior CT chest in February 2022 with multiple pulmonary nodules. - Ordered new CT chest - Labs: CBC with differential, LDH, hemoglobin A1c, HIV, hepatitis C, hepatitis B, RPR, TSH Encounter for health examination of refugee Patient with financial difficulties, language barrier, currently uninsured. - Contacted Jeremy Rivas and Jeremy Rivas for patient assistance with insurance - Referral for VBCI     Jeremy KANDICE Lee, DO [facility]

## 2024-05-29 NOTE — Congregational Nurse Program (Addendum)
  Dept: [phone]   Congregational Nurse Program Note  Date of Encounter: 05/29/2024  Past Medical History: Past Medical History:  Diagnosis Date   Hearing impaired    Left ear    Hyperopia     Encounter Details:  Community Questionnaire - 05/29/24 0744       Questionnaire   Ask client: Do you give verbal consent for me to treat you today? Yes    Student Assistance N/A    Location Patient Served  [facility]    Encounter Setting Phone/Text/Email    Population Status Migrant/Refugee    Engineer, [employer] or [place] Insurance    Insurance/Financial Assistance Referral N/A    Medication Have Medication Insecurities    Medical Provider Yes    Screening Referrals Made N/A    Medical Referrals Made [facility] PCP[facility]inic    Medical Appointment Completed N/A    CNP Interventions Advocate/Support;Navigate Healthcare System;Case Management;Counsel;Educate    Screenings CN Performed N/A    ED Visit Averted Yes    Life-Saving Intervention Made N/A         Patient is having abdominal pain according to son. He was unable to work yesterday and had to go sleep in the car. I will try to have him seen at PCP today if possible, otherwise I will advise going to Urgent care.  Naomie Arun Herrod RN BSN PCCN  [facility] [phone]-cell [phone]-office

## 2024-05-29 NOTE — Patient Instructions (Addendum)
 Uyu munsi twaganiriye kububabare bwo munda. Turimo gukora ibizamini byinshi kugirango tumenye ibibera. Ufite gahunda icyumweru gitaha cyo kugaruka no kuganira kubisubizo. Kububabare bwawe, ngiye kohereza mumiti. Yitwa omeprazole . Ny[place]uneka fata ibinini 1 buri munsi mugitondo.  Kandi, nahamagaye Victoria na Dorothy kandi barimo gukora kugirango ubone ubwishingizi bwawe.  Today we talked about your abdominal pain. We are doing multiple tests to figure out what is going on. You have an appointment next week to come back and talk about those results. For your pain, I am going to send in a medicine. It is called omeprazole . Please take 1 pill everyday in the morning.  Also, I have contacted Victoria and Naomie and they are working on ambulance person set-up.

## 2024-05-30 ENCOUNTER — Inpatient Hospital Stay (HOSPITAL_COMMUNITY)
Admission: EM | Admit: 2024-05-30 | Discharge: 2024-06-01 | DRG: 442 | Disposition: A | Discharge status: Home / Self Care | Source: Ambulatory Visit | Attending: Family Medicine | Admitting: Family Medicine

## 2024-05-30 ENCOUNTER — Encounter (HOSPITAL_COMMUNITY): Payer: Self-pay | Admitting: Emergency Medicine

## 2024-05-30 ENCOUNTER — Telehealth: Payer: Self-pay | Admitting: Family Medicine

## 2024-05-30 ENCOUNTER — Telehealth: Payer: Self-pay

## 2024-05-30 ENCOUNTER — Other Ambulatory Visit: Payer: Self-pay

## 2024-05-30 ENCOUNTER — Emergency Department (HOSPITAL_COMMUNITY)

## 2024-05-30 DIAGNOSIS — N179 Acute kidney failure, unspecified: Principal | ICD-10-CM | POA: Diagnosis present

## 2024-05-30 DIAGNOSIS — R7989 Other specified abnormal findings of blood chemistry: Secondary | ICD-10-CM

## 2024-05-30 DIAGNOSIS — J984 Other disorders of lung: Secondary | ICD-10-CM | POA: Diagnosis not present

## 2024-05-30 DIAGNOSIS — D72819 Decreased white blood cell count, unspecified: Secondary | ICD-10-CM | POA: Diagnosis present

## 2024-05-30 DIAGNOSIS — E0590 Thyrotoxicosis, unspecified without thyrotoxic crisis or storm: Secondary | ICD-10-CM | POA: Diagnosis present

## 2024-05-30 DIAGNOSIS — Z789 Other specified health status: Secondary | ICD-10-CM

## 2024-05-30 DIAGNOSIS — M8588 Other specified disorders of bone density and structure, other site: Secondary | ICD-10-CM | POA: Diagnosis present

## 2024-05-30 DIAGNOSIS — K760 Fatty (change of) liver, not elsewhere classified: Secondary | ICD-10-CM | POA: Diagnosis not present

## 2024-05-30 DIAGNOSIS — R634 Abnormal weight loss: Secondary | ICD-10-CM

## 2024-05-30 DIAGNOSIS — B179 Acute viral hepatitis, unspecified: Principal | ICD-10-CM | POA: Diagnosis present

## 2024-05-30 DIAGNOSIS — R918 Other nonspecific abnormal finding of lung field: Secondary | ICD-10-CM | POA: Diagnosis present

## 2024-05-30 DIAGNOSIS — Z603 Acculturation difficulty: Secondary | ICD-10-CM | POA: Diagnosis present

## 2024-05-30 DIAGNOSIS — R7401 Elevation of levels of liver transaminase levels: Secondary | ICD-10-CM | POA: Diagnosis present

## 2024-05-30 DIAGNOSIS — M4856XA Collapsed vertebra, not elsewhere classified, lumbar region, initial encounter for fracture: Secondary | ICD-10-CM | POA: Diagnosis present

## 2024-05-30 DIAGNOSIS — E041 Nontoxic single thyroid nodule: Secondary | ICD-10-CM | POA: Diagnosis not present

## 2024-05-30 DIAGNOSIS — H9192 Unspecified hearing loss, left ear: Secondary | ICD-10-CM | POA: Diagnosis present

## 2024-05-30 DIAGNOSIS — D649 Anemia, unspecified: Secondary | ICD-10-CM | POA: Diagnosis present

## 2024-05-30 DIAGNOSIS — R748 Abnormal levels of other serum enzymes: Secondary | ICD-10-CM | POA: Insufficient documentation

## 2024-05-30 DIAGNOSIS — K219 Gastro-esophageal reflux disease without esophagitis: Secondary | ICD-10-CM | POA: Diagnosis present

## 2024-05-30 DIAGNOSIS — R109 Unspecified abdominal pain: Secondary | ICD-10-CM | POA: Insufficient documentation

## 2024-05-30 HISTORY — DX: Acute viral hepatitis, unspecified: B17.9

## 2024-05-30 LAB — URINALYSIS, ROUTINE W REFLEX MICROSCOPIC
Bilirubin Urine: NEGATIVE
Glucose, UA: NEGATIVE mg/dL
Hgb urine dipstick: NEGATIVE
Ketones, ur: NEGATIVE mg/dL
Leukocytes,Ua: NEGATIVE
Nitrite: NEGATIVE
Protein, ur: NEGATIVE mg/dL
Specific Gravity, Urine: 1.011 (ref 1.005–1.030)
pH: 8 (ref 5.0–8.0)

## 2024-05-30 LAB — CBC WITH DIFFERENTIAL/PLATELET
Abs Immature Granulocytes: 0.02 K/uL (ref 0.00–0.07)
Basophils Absolute: 0 K/uL (ref 0.0–0.1)
Basophils Relative: 1 %
Eosinophils Absolute: 0.2 K/uL (ref 0.0–0.5)
Eosinophils Relative: 5 %
HCT: 34.6 % — ABNORMAL LOW (ref 39.0–52.0)
Hemoglobin: 11.8 g/dL — ABNORMAL LOW (ref 13.0–17.0)
Immature Granulocytes: 1 %
Lymphocytes Relative: 21 %
Lymphs Abs: 0.7 K/uL (ref 0.7–4.0)
MCH: 33.4 pg (ref 26.0–34.0)
MCHC: 34.1 g/dL (ref 30.0–36.0)
MCV: 98 fL (ref 80.0–100.0)
Monocytes Absolute: 0.4 K/uL (ref 0.1–1.0)
Monocytes Relative: 12 %
Neutro Abs: 2 K/uL (ref 1.7–7.7)
Neutrophils Relative %: 60 %
Platelets: 154 K/uL (ref 150–400)
RBC: 3.53 MIL/uL — ABNORMAL LOW (ref 4.22–5.81)
RDW: 13.3 % (ref 11.5–15.5)
WBC: 3.3 K/uL — ABNORMAL LOW (ref 4.0–10.5)
nRBC: 0 % (ref 0.0–0.2)

## 2024-05-30 LAB — COMPREHENSIVE METABOLIC PANEL WITH GFR
ALT: 1381 U/L — ABNORMAL HIGH (ref 0–44)
AST: 1915 U/L — ABNORMAL HIGH (ref 15–41)
Albumin: 2.7 g/dL — ABNORMAL LOW (ref 3.5–5.0)
Alkaline Phosphatase: 94 U/L (ref 38–126)
Anion gap: 10 (ref 5–15)
BUN: 24 mg/dL — ABNORMAL HIGH (ref 8–23)
CO2: 22 mmol/L (ref 22–32)
Calcium: 7.7 mg/dL — ABNORMAL LOW (ref 8.9–10.3)
Chloride: 103 mmol/L (ref 98–111)
Creatinine, Ser: 1.9 mg/dL — ABNORMAL HIGH (ref 0.61–1.24)
GFR, Estimated: 37 mL/min — ABNORMAL LOW (ref >60)
Glucose, Bld: 132 mg/dL — ABNORMAL HIGH (ref 70–99)
Potassium: 4.5 mmol/L (ref 3.5–5.1)
Sodium: 135 mmol/L (ref 135–145)
Total Bilirubin: 3.2 mg/dL — ABNORMAL HIGH (ref 0.0–1.2)
Total Protein: 7.2 g/dL (ref 6.5–8.1)

## 2024-05-30 LAB — CBC
HCT: 33.7 % — ABNORMAL LOW (ref 39.0–52.0)
Hemoglobin: 11.9 g/dL — ABNORMAL LOW (ref 13.0–17.0)
MCH: 34.4 pg — ABNORMAL HIGH (ref 26.0–34.0)
MCHC: 35.3 g/dL (ref 30.0–36.0)
MCV: 97.4 fL (ref 80.0–100.0)
Platelets: 153 K/uL (ref 150–400)
RBC: 3.46 MIL/uL — ABNORMAL LOW (ref 4.22–5.81)
RDW: 13.2 % (ref 11.5–15.5)
WBC: 3.1 K/uL — ABNORMAL LOW (ref 4.0–10.5)
nRBC: 0 % (ref 0.0–0.2)

## 2024-05-30 LAB — HEPATITIS PANEL, ACUTE
HCV Ab: NONREACTIVE
Hep A IgM: NONREACTIVE
Hep B C IgM: NONREACTIVE
Hepatitis B Surface Ag: NONREACTIVE

## 2024-05-30 LAB — T4F: T4,Free (Direct): 1.35 ng/dL (ref 0.82–1.77)

## 2024-05-30 LAB — LIPASE, BLOOD: Lipase: 65 U/L — ABNORMAL HIGH (ref 11–51)

## 2024-05-30 LAB — CK: Total CK: 116 U/L (ref 49–397)

## 2024-05-30 LAB — TSH RFX ON ABNORMAL TO FREE T4: TSH: 0.171 u[IU]/mL — ABNORMAL LOW (ref 0.450–4.500)

## 2024-05-30 LAB — FOLATE: Folate: 10.9 ng/mL (ref >5.9)

## 2024-05-30 MED ORDER — OXYCODONE HCL 5 MG PO TABS
2.5000 mg | ORAL_TABLET | ORAL | Status: DC | PRN
  Administered 2024-05-30: 2.5 mg via ORAL
  Filled 2024-05-30: qty 1

## 2024-05-30 MED ORDER — PANTOPRAZOLE SODIUM 40 MG IV SOLR
40.0000 mg | Freq: Two times a day (BID) | INTRAVENOUS | Status: DC
  Administered 2024-05-30 – 2024-06-01 (×5): 40 mg via INTRAVENOUS
  Filled 2024-05-30 (×5): qty 10

## 2024-05-30 MED ORDER — ENOXAPARIN SODIUM 40 MG/0.4ML IJ SOSY
40.0000 mg | PREFILLED_SYRINGE | INTRAMUSCULAR | Status: DC
  Administered 2024-05-30 – 2024-05-31 (×2): 40 mg via SUBCUTANEOUS
  Filled 2024-05-30 (×2): qty 0.4

## 2024-05-30 MED ORDER — LACTATED RINGERS IV SOLN: INTRAVENOUS | Status: AC

## 2024-05-30 NOTE — Telephone Encounter (Signed)
 Able to reach patient's son Camellia Cloud with congregational RN [name] serving as Risk manager.   Discussed abnormal labs showing liver and kidney failure. Discussed that these are very severe and risk of worsening and death and need to go to the hospital today. He notes he is unable to take his father today and asks if he can take him tomorrow. Explained risk of further deterioration and recommended we could call 911 to take him to hospital, son notes he will change his plans and bring him to the ED by 11 am.   Will update FMTS inpatient team, suspect admission and if to floor we will be happy to take care of him.  Rollene Keeling MD

## 2024-05-30 NOTE — Telephone Encounter (Signed)
 Grossly abnormal labs noted from yesterdays visit, recommend patient report to ED.  Dr Lennie called pt with interpreter and left a voicemail. Other emergency contacts in the chart called and did not pick up.  Also messaged congregational RN involved in patients care to try to reach out to patient and family. Will continue to try to reach patient.  Rollene Keeling MD

## 2024-05-30 NOTE — Progress Notes (Signed)
 PIV consult: Attempted use of interpreter tablet for PIV insertion. No Kinyarwandan language interpreter available. Son at bedside translated for patient.

## 2024-05-30 NOTE — Plan of Care (Addendum)
 FMTS Brief Progress Note  S: To bedside with Dr. Larraine for night rounds Pt denies acute concerns at this time, states he just received pain med and is hoping to go to sleep   O: BP 109/77 (BP Location: Right Arm)   Pulse 82   Temp 98 F (36.7 C)   Resp 16   Ht 5' 10 (1.778 m)   Wt 66 kg   SpO2 99%   BMI 20.88 kg/m    Gen: NAD, resting comfortably Resp: no resp distress, normal WOB on RA Neuro: Awake, alert, responding appropriately  A/P:  Elevated liver enzymes AKI -GI following -Continue PPI and fluids -AM labs  Remainder per HP  - Orders reviewed. Labs for AM ordered, which was adjusted as needed.  - If condition changes, plan includes page primary team.   Romelle Booty, MD 05/30/2024, 8:10 PM PGY-3, [facility] Night Resident  Please page [phone] with questions.

## 2024-05-30 NOTE — Hospital Course (Addendum)
 Jeremy Rivas is a 73 y.o. male Kinyarwanda speaking refugee with history of mediastinal mass, strongyloides infection, who was admitted for abdominal pain and found to have transaminitis with associated AKI.  Abdominal pain Elevated liver enzymes Patient presenting with 1 month of abdominal pain, found to have significant elevated liver enzymes (AST 5000, ALT 2000).  CT chest, abdomen, pelvis without contrast unremarkable, however patient had rising bilirubin from 1.4-3.2 in 24 hours.  Hepatitis panel was obtained which showed Hep A/B/C nonreactive, and hemolysis workup showed elevated ferritin of 2,655.  GI was consulted, who recommended further lab workup which patient declined due to us  taking too much blood from him. He will need outpatient follow up with GI for continued workup and management.   Acute kidney injury Elevated serum creatinine of 1.9, baseline of 0.6-0.7.  Patient initiated on IV fluids, with improvement of Cr prior to discharge. Unfortunately patient declined further labs on day of discharge.  Other chronic conditions were medically managed with home medications and formulary alternatives as necessary (GERD, L sided hearing impairment)  Follow-up recommendations Recheck hepatic function panel  Obtain CMV IgM, Ebstein-Barr virus VCA, IgM, Hepatitis D antibiotics IgM and IgG, PT-INR Start treatment for H Pylori once LFTs normalize.  Spine exam - L1 compression fx on CT. Follow up outpatient. Asymptomatic.

## 2024-05-30 NOTE — Telephone Encounter (Signed)
 I have called nutritional services and assisted patient to order breakfast and lunch for tomorrow. Daughter stated that he is good for dinner today. He  had rice and beans from home.  Naomie Elandra Powell RN BSN PCCN  [facility] [phone]-cell [phone]-office

## 2024-05-30 NOTE — Congregational Nurse Program (Signed)
  Dept: [phone]   Congregational Nurse Program Note  Date of Encounter: 05/30/2024  Past Medical History: Past Medical History:  Diagnosis Date   Hearing impaired    Left ear    Hyperopia     Encounter Details:  Community Questionnaire - 05/30/24 0939       Questionnaire   Ask client: Do you give verbal consent for me to treat you today? Yes    Student Assistance N/A    Location Patient Served  [facility]    Encounter Setting Phone/Text/Email    Population Status Migrant/Refugee    [employer] or [place] Insurance    Insurance/Financial Assistance Referral N/A    Medication Have Medication Insecurities    Medical Provider Yes    Screening Referrals Made N/A    Medical Referrals Made ED    Medical Appointment Completed [facility]    CNP Interventions Advocate/Support;Navigate Healthcare System;Case Management;Counsel;Educate    Screenings CN Performed N/A    ED Visit Averted Yes    Life-Saving Intervention Made N/A         Received communication from PCP regarding abnormal labs. Provider unable to reach patient or family via phone. Correct phone numbers provided and updated on chart. 3 way call initiated. Lab test results explained to patient son Camellia by Dr Donzetta and informed that patient will need to present to [facility] ER. Camellia confirmed that he will take patient to ER today by 11 am.  Naomie Aly RN BSN PCCN  [facility] [phone] [phone]-office

## 2024-05-30 NOTE — ED Triage Notes (Signed)
 Pt sent from PCP for admission due to elevated kidney function and liver enzymes. Endorses abd pain and nausea for one month.

## 2024-05-30 NOTE — Assessment & Plan Note (Addendum)
-   Admit to FMTS, attending Dr. Madelon - MedSurg, Vital signs per floor - Consults: GI - Protonix  40 mg IV BID  - F/u Hepatis panel  - Pain control: avoid tylenol , can consider opiates for pain control  - AM Labs: hepatic function panel, BMP

## 2024-05-30 NOTE — ED Provider Notes (Signed)
 [place] EMERGENCY DEPARTMENT AT [facility] Provider Note   CSN: [id number] Arrival date & time: 05/30/24  1109     Patient presents with: Abnormal Lab   Jeremy Rivas is a 73 y.o. male.    Abnormal Lab  Patient presents today after being called by his PCP to come to the emergency department for abnormal labs that were taken yesterday.  Patient presented to primary care office visit yesterday with a month of constant abdominal pain, decreased appetite, weight loss, nighttime sweats.  Labs from yesterday consistent with LDH of 1500, AST 5500, ALT 2400 GFR 36, creatinine 1.94, calcium 7.8.   Patient here today with 2 of his sons.  Virtual interpreter used.  They came here because their primary care provider told them to, patient has had weight loss, anorexia, and persistent abdominal pain for about a month.  He denies changes in vision, weakness, changes in stool or urination.    Prior to Admission medications   Medication Sig Start Date End Date Taking? Authorizing Provider  omeprazole  (PRILOSEC) 20 MG capsule Take 1 capsule (20 mg total) by mouth daily. 05/29/24  Yes Baker, Raguel MATSU, DO  carbamide peroxide (DEBROX) 6.5 % OTIC solution Place 5 drops into both ears 2 (two) times daily. Patient not taking: Reported on 05/30/2024 06/11/20   Austin Ade, MD  dicyclomine  (BENTYL ) 20 MG tablet Take 1 tablet (20 mg total) by mouth 2 (two) times daily. Patient not taking: Reported on 05/30/2024 01/09/21   Mesner, Selinda, MD    Allergies: Patient has no known allergies.    Review of Systems  Updated Vital Signs BP 118/77   Pulse 84   Temp 98.5 F (36.9 C) (Oral)   Resp 18   Ht 5' 10 (1.778 m)   Wt 66 kg   SpO2 99%   BMI 20.88 kg/m   Physical Exam Vitals reviewed.  Constitutional:      Appearance: Normal appearance. He is not toxic-appearing or diaphoretic.  HENT:     Nose: Nose normal.     Mouth/Throat:     Mouth: Mucous membranes are moist.     Pharynx:  Oropharynx is clear.  Eyes:     General:        Right eye: No discharge.        Left eye: No discharge.     Comments: Darkening of bilateral conjunctiva  Cardiovascular:     Rate and Rhythm: Normal rate and regular rhythm.     Pulses: Normal pulses.  Pulmonary:     Effort: Pulmonary effort is normal. No respiratory distress.     Breath sounds: Normal breath sounds.  Abdominal:     General: Bowel sounds are normal. There is no distension.     Palpations: Abdomen is soft. There is no mass.     Comments: Tenderness to palpation of the left upper quadrant extending to the mid axillary line  Musculoskeletal:        General: No swelling, deformity or signs of injury.     Cervical back: No tenderness.     Right lower leg: No edema.     Left lower leg: No edema.     Comments: Neg tenderness along spinous process from cervical to sacral  Skin:    General: Skin is warm.     Findings: No rash.  Neurological:     General: No focal deficit present.     Mental Status: He is alert and oriented to person, place, and time.     (  all labs ordered are listed, but only abnormal results are displayed) Labs Reviewed  LIPASE, BLOOD - Abnormal; Notable for the following components:      Result Value   Lipase 65 (*)    All other components within normal limits  COMPREHENSIVE METABOLIC PANEL WITH GFR - Abnormal; Notable for the following components:   Glucose, Bld 132 (*)    BUN 24 (*)    Creatinine, Ser 1.90 (*)    Calcium 7.7 (*)    Albumin 2.7 (*)    AST 1,915 (*)    ALT 1,381 (*)    Total Bilirubin 3.2 (*)    GFR, Estimated 37 (*)    All other components within normal limits  CBC - Abnormal; Notable for the following components:   WBC 3.1 (*)    RBC 3.46 (*)    Hemoglobin 11.9 (*)    HCT 33.7 (*)    MCH 34.4 (*)    All other components within normal limits  URINALYSIS, ROUTINE W REFLEX MICROSCOPIC  DIFFERENTIAL  HEPATITIS PANEL, ACUTE    EKG: None  Radiology: CT CHEST ABDOMEN  PELVIS WO CONTRAST Result Date: 05/30/2024 CLINICAL DATA:  Elevated LFTs.  Lung nodule seen on the prior CT. EXAM: CT CHEST, ABDOMEN AND PELVIS WITHOUT CONTRAST TECHNIQUE: Multidetector CT imaging of the chest, abdomen and pelvis was performed following the standard protocol without IV contrast. RADIATION DOSE REDUCTION: This exam was performed according to the departmental dose-optimization program which includes automated exposure control, adjustment of the mA and/or kV according to patient size and/or use of iterative reconstruction technique. COMPARISON:  Chest CT dated 08/12/2020 and CT abdomen pelvis dated 01/09/2021. FINDINGS: Evaluation of this exam is limited in the absence of intravenous contrast. CT CHEST FINDINGS Cardiovascular: There is no cardiomegaly or pericardial effusion. The thoracic aorta and central pulmonary arteries are grossly unremarkable. Mediastinum/Nodes: No hilar or mediastinal adenopathy. The esophagus is grossly unremarkable. No mediastinal fluid collection. Lungs/Pleura: Stable area of nodular scarring in the subpleural lateral right lung base measuring 7 mm. Additional linear nodular scarring in the right upper lobe as seen on the prior CT. Similar appearance of a 4 mm right middle lobe nodule along the minor fissure. No focal consolidation, pleural effusion, or pneumothorax. The central airways are patent. Musculoskeletal: Osteopenia.  No acute osseous pathology. CT ABDOMEN PELVIS FINDINGS No intra-abdominal free air.  Small free fluid in the pelvis. Hepatobiliary: Fatty liver. No biliary dilatation. The gallbladder is unremarkable. Pancreas: Unremarkable. No pancreatic ductal dilatation or surrounding inflammatory changes. Spleen: Normal in size without focal abnormality. Adrenals/Urinary Tract: The adrenal glands unremarkable there is no hydronephrosis or nephrolithiasis on either side. The visualized ureters and urinary bladder unremarkable. Stomach/Bowel: There is moderate  stool throughout the colon. There is no bowel obstruction or active inflammation. The appendix is normal. Vascular/Lymphatic: The abdominal aorta and IVC unremarkable on this noncontrast CT. No portal venous gas. There is no adenopathy. Reproductive: The prostate and seminal vesicles are grossly unremarkable. Other: None Musculoskeletal: Osteopenia with degenerative changes of the spine. Age indeterminate L1 compression fracture with approximately 30% loss of vertebral body height, new since the CT of 01/09/2021. Correlation with clinical exam and point tenderness recommended. IMPRESSION: 1. No acute intrathoracic, abdominal, or pelvic pathology. 2. Fatty liver. 3. Age indeterminate L1 compression fracture, new since the CT of 01/09/2021. Correlation with clinical exam and point tenderness recommended. 4. Stable pulmonary nodules and scarring. Electronically Signed   By: Vanetta Chou M.D.   On: 05/30/2024 13:47  Procedures   Medications Ordered in the ED  lactated ringers  infusion (has no administration in time range)  enoxaparin  (LOVENOX ) injection 40 mg (has no administration in time range)  pantoprazole  (PROTONIX ) injection 40 mg (has no administration in time range)                                    Medical Decision Making Amount and/or Complexity of Data Reviewed Labs: ordered. Radiology: ordered.  Risk Decision regarding hospitalization.   This patient presents to the ED for concern of abnormal lab results from his primary care provider office visit yesterday, this involves an extensive number of treatment options, and is a complaint that carries with it a high risk of complications and morbidity.  The differential diagnosis includes liver failure due to viral hepatitis, ischemic hepatitis, drug-induced liver injury, primary or metastatic hepatic malignancy, pancreatic malignancy with hepatic involvement, disseminated Strongyloides ease hyper infection syndrome, underlying  chronic kidney disease, acute on chronic kidney disease, or acute kidney injury.   Co morbidities / Chronic conditions that complicate the patient evaluation  Pulmonary nodules, visual impairment, Strongyloides ease infection, thyroid  nodule   Additional history obtained:  Additional history obtained from EMR External records from outside source obtained and reviewed including last office visit with primary care provider   Lab Tests:  I Ordered, and personally interpreted labs.  The pertinent results include: WBC 3.1 hemoglobin 11.9 Glucose 132, BUN 24, creatinine 1.9, calcium 7.7, albumin 2.7, AST 1900, ALT 1300, total bilirubin 3.2 GFR 37 Lipase 65   Imaging Studies ordered:  I ordered imaging studies including CT chest abdomen pelvis without contrast I independently visualized and interpreted imaging which showed stable pulmonary nodules, and no intrathoracic, intra-abdominal or pelvic pathology I agree with the radiologist interpretation   Cardiac Monitoring: / EKG:  Patient was not maintained on cardiac monitor   Problem List / ED Course / Critical interventions / Medication management  Abnormal LFTs, creatinine and a month of abdominal pain, weight loss and night sweats. Patient was asked by his primary care provider to come to the emergency department for admission due to lab results that were obtained yesterday.  Patient was admitted to the medicine service on arrival and labs were obtained and CT imaging obtained at the request of admitting family medicine teaching service team.   I have reviewed the patients home medicines and have made adjustments as needed   Consultations Obtained:  I requested consultation with the family medicine teaching service,  and discussed lab and imaging findings as well as pertinent plan - they recommend: Admission   Social Determinants of Health:  Support of son's   Test / Admission - Considered:  Due to abnormal results and  need for continued workup, will admit patient      Final diagnoses:  Acute renal failure, unspecified acute renal failure type  Abnormal LFTs  Weight loss    ED Discharge Orders     None          Charmayne Holmes, DO 05/30/24 1459    Lowther, Amy, DO 06/01/24 1922

## 2024-05-30 NOTE — H&P (Signed)
 Hospital Admission History and Physical Service Pager: [phone]  Patient name: Jeremy Rivas Medical record number: [id number] Date of Birth: 01-Oct-1950 Age: 73 y.o. Gender: male  Primary Care Provider: Lennie Raguel MATSU, DO Consultants: GI Code Status: Full code which was confirmed with family if patient unable to confirm   Preferred Emergency Contact:  Contact Information     Name Relation Home Work Mobile   Jeremy Rivas Spouse   [phone]   Jeremy Rivas Jeremy Rivas   [phone]   Jeremy Rivas   [phone]      Other Contacts   None on File      Chief Complaint: Abdominal Pain  Assessment and Plan: Jeremy Rivas is a 73 y.o. male refugee patient with a PMHx of prior strongyloidiasis infection presenting with 1 month history of abdominal pain.  Patient presented to ED after strong recommendation from clinic PCP once liver enzymes came back significantly elevated.    Differential for presentation of this includes malignancy, pancreatitis with elevated lipase, cholecystitis. Awaiting CT chest, AP results. Will consult GI once CT results. LFTs downtrending from outpatient. Most concerned about malignancy due to concurrent weight loss and loss of appetite.   Assessment & Plan Transaminitis Elevated liver enzymes Abdominal pain - Admit to FMTS, attending Dr. Madelon - MedSurg, Vital signs per floor - Consults: GI - Protonix  40 mg IV BID  - F/u Hepatis panel  - Pain control: avoid tylenol , can consider opiates for pain control  - AM Labs: hepatic function panel, BMP AKI (acute kidney injury) Cr elevated to 1.9 on admission. Suspect prerenal from poor PO intake.  - LR 100 mL/hr  - recheck BMP tomorrow morning  Chronic health problem GERD Hearing impairment L ear   FEN/GI: regular diet  VTE Prophylaxis: Lovenox    Disposition: MedSurg  History of Present Illness:  Jeremy Rivas is a 73 y.o. male presenting with 1 month of abdominal pain.  He states the pain is in his lower and upper abdomen. It also wraps around his ribs. He states that he has had decreased appetite during this time and has not been able to eat much. He is not drinking as much is normal. He states that he is throwing up roughly 1 time a day due to this. Denies any blood or coffee-ground emesis. States he is throwing up yellow. He rates this pain as a 10 out of 10. He states it never gets better goes away. He cannot identify anything that makes the pain better or worse. He feels a sensation of fullness in his stomach like something is in it. He denies any diarrhea, states he has been constipated with hard stools. Denies any blood or dark black tarry stools.    In the ED, CMP repeated and showed down-trending LFTs and CT chest abdomen pelvis unremarkable. FMTS called for admission.   Review Of Systems: Per HPI with the following addition  Pertinent Past Medical History: Hyperopia Hearing impairment L ear  Remainder reviewed in history tab.   Pertinent Past Surgical History: None  Remainder reviewed in history tab.   Pertinent Social History: Tobacco use: No Alcohol use: Socially, occasional beer after work with liquor some weekends Other Substance use: denies Lives with son  Patient is a refugee who was in a camp in [place] for 16 - 19 years with a language barrier.  Patient's primary language is Kinyarwanda/Rwanda.   Pertinent Family History: None pertinent  Remainder reviewed in history tab.   Important Outpatient Medications: Debrox 6.5% otic solution  Bentyl  20 mg tablet twice daily Omeprazole  20 mg capsules twice daily  Remainder reviewed in medication history.   Objective: BP 118/77   Pulse 84   Temp 98.5 F (36.9 C) (Oral)   Resp 18   Ht 5' 10 (1.778 m)   Wt 66 kg   SpO2 99%   BMI 20.88 kg/m  Exam: General: Chronically ill-appearing, no acute distress Cardio: RRR, no murmur on exam Pulm: clear, No increased work of  breathing Abdomen: Soft, bowel sounds present, tender in epigastric region  Extremity: No peripheral edema   Labs:  CBC BMET  Recent Labs  Lab 05/30/24 1121  WBC 3.1*  HGB 11.9*  HCT 33.7*  PLT 153   Recent Labs  Lab 05/30/24 1121  NA 135  K 4.5  CL 103  CO2 22  BUN 24*  CREATININE 1.90*  GLUCOSE 132*  CALCIUM 7.7*    Pertinent additional labs UA negative, bilirubin 3.2.  EKG: pending   Imaging Studies Performed:  CT CAP:  IMPRESSION: 1. No acute intrathoracic, abdominal, or pelvic pathology. 2. Fatty liver. 3. Age indeterminate L1 compression fracture, new since the CT of 01/09/2021. Correlation with clinical exam and point tenderness recommended. 4. Stable pulmonary nodules and scarring.   Jeremy Perkins, DO 05/30/2024, 2:22 PM PGY-3, [facility]  FPTS Intern pager: [phone], text pages welcome Secure chat group [facility] Teaching Service

## 2024-05-30 NOTE — Telephone Encounter (Signed)
 Called patient with Kinyar[place] interpreter after receiving critical lab result showing elevated creatinine and liver enzymes. Reached patient's voicemail and had interpreter leave a message advising the patient to go to the emergency department for further evaluation.   [name], DO

## 2024-05-30 NOTE — Progress Notes (Signed)
 Pt arrived from ED via wheelchair at approx 1500.  Son at the bedside.  Pt arrived in home clothing and had no IV access.  IV team consulted at this time.

## 2024-05-30 NOTE — Consult Note (Addendum)
 Consultation Note   Referring Provider:   Teaching Service PCP: Jeremy Raguel MATSU, DO Primary Gastroenterologist:  Jeremy Rivas       Reason for Consultation:  Abdominal pain,  DOA: 05/30/2024         Hospital Day: 1   ASSESSMENT    73 year old male, non-English-speaking from [place] ( speaks [place]) admitted with upper abdominal pain, vomiting,  weight loss , AKI, and markedly elevated LFTs ( predominantly hepatocellular).   GI symptoms are subacute, suspect LFT elevation acute. Given that he also has AKI, query if may have rhabdomyolysis related to poor PO intake? Of course viral and other etiologies of abnormal LFTs needs to be investigated as does the cause of abdominal pain, vomiting and weight loss.  LFTs are significantly improved overnight.  Workup so far:  --Non contrast CT chest /AP without acute findings, specifically no hepatobiliary findings.  --Acute hepatitis panel pending.  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 --HCV ab neg --HIV negative --No tylenol  use  Mild normocytic anemia Leukopenia Presenting hemoglobin 11.9. No overt GI blood loss.    Thyroid  nodule Admitting team getting thyroid  ultrasound  See PMH for any additional medical history  / medical problems  Principal Problem:   Transaminitis Active Problems:   Elevated liver enzymes   Abdominal pain   Chronic health problem   AKI (acute kidney injury)    PLAN:   --Will add PT / INR --CK, aldolase --AM LFTs --Empiric PPI --prn anti-emetics --Awaiting acute hepatitis panel --Will add hepatitis delta if available ( reactivation of Hep B ?) --Iron studies -- If nausea, vomiting, upper abdominal pain persist he may need upper endoscopy at some point.  Of note, epigastric pain is reproducible on exam so there is some degree of musculoskeletal pain.   --There is an H.pylori breath test pending  HPI   Brief History:    **Unable to connect to interpreter via video despite waiting several minutes .  Son present , speaks limited English.   Patient is a 73 year old male with a past history of strongyloidiasis infection.  He was admitted today with a 1 month history of upper abdominal pain, intermittent nausea, vomiting over the last month.  Outpatient labs yesterday remarkable for AST around 5500 /ALT 2400.  Bilirubin 1.4 and alkaline phosphatase normal.  Per son, patient has been drinking fluids but eating very little over the last month.  He has lost several pounds.  He describes almost daily upper abdominal pain.  The pain does not seem to be related to eating.  It bothers him when he moves around but can also bother him at rest.  The pain does not radiate through to his back..  Patient does not take any NSAIDs.  He does not take any medications at home.SABRA  He has no blood in his stools or black stools.  No bloody emesis.    Regarding elevation in liver chemistries.  Patient does not take any medications at home.  Specifically he takes no Tylenol .  He takes no herbs or supplements .  He has not left the country recently.  No recent contact  with anyone ill.  No fevers.   Admission labs remarkable for BUN 32, creatinine 1.9, lipase 65, AST 5578, ALT 2444, T. bili 1.4, LDH 1519.  WBC 3.1, hemoglobin 11.9, MCV 97  Labs and Imaging:  Recent Labs    05/29/24 1042 05/30/24 1121  PROT 7.5 7.2  ALBUMIN 3.3* 2.7*  AST 5,578* 1,915*  ALT 2,444* 1,381*  ALKPHOS 122 94  BILITOT 1.4* 3.2*   Recent Labs    05/29/24 1042 05/30/24 1121  WBC 7.3 3.1*  HGB 11.9* 11.9*  HCT 35.1* 33.7*  MCV 98* 97.4  PLT 186 153   Recent Labs    05/29/24 1042 05/30/24 1121  NA 134 135  K 4.8 4.5  CL 96 103  CO2 22 22  GLUCOSE 122* 132*  BUN 32* 24*  CREATININE 1.94* 1.90*  CALCIUM 7.8* 7.7*     CT CHEST ABDOMEN PELVIS WO CONTRAST CLINICAL DATA:   Elevated LFTs.  Lung nodule seen on the prior CT.  EXAM: CT CHEST, ABDOMEN AND PELVIS WITHOUT CONTRAST  TECHNIQUE: Multidetector CT imaging of the chest, abdomen and pelvis was performed following the standard protocol without IV contrast.  RADIATION DOSE REDUCTION: This exam was performed according to the departmental dose-optimization program which includes automated exposure control, adjustment of the mA and/or kV according to patient size and/or use of iterative reconstruction technique.  COMPARISON:  Chest CT dated 08/12/2020 and CT abdomen pelvis dated 01/09/2021.  FINDINGS: Evaluation of this exam is limited in the absence of intravenous contrast.  CT CHEST FINDINGS  Cardiovascular: There is no cardiomegaly or pericardial effusion. The thoracic aorta and central pulmonary arteries are grossly unremarkable.  Mediastinum/Nodes: No hilar or mediastinal adenopathy. The esophagus is grossly unremarkable. No mediastinal fluid collection.  Lungs/Pleura: Stable area of nodular scarring in the subpleural lateral right lung base measuring 7 mm. Additional linear nodular scarring in the right upper lobe as seen on the prior CT. Similar appearance of a 4 mm right middle lobe nodule along the minor fissure. No focal consolidation, pleural effusion, or pneumothorax. The central airways are patent.  Musculoskeletal: Osteopenia.  No acute osseous pathology.  CT ABDOMEN PELVIS FINDINGS  No intra-abdominal free air.  Small free fluid in the pelvis.  Hepatobiliary: Fatty liver. No biliary dilatation. The gallbladder is unremarkable.  Pancreas: Unremarkable. No pancreatic ductal dilatation or surrounding inflammatory changes.  Spleen: Normal in size without focal abnormality.  Adrenals/Urinary Tract: The adrenal glands unremarkable there is no hydronephrosis or nephrolithiasis on either side. The visualized ureters and urinary bladder unremarkable.  Stomach/Bowel: There is  moderate stool throughout the colon. There is no bowel obstruction or active inflammation. The appendix is normal.  Vascular/Lymphatic: The abdominal aorta and IVC unremarkable on this noncontrast CT. No portal venous gas. There is no adenopathy.  Reproductive: The prostate and seminal vesicles are grossly unremarkable.  Other: None  Musculoskeletal: Osteopenia with degenerative changes of the spine. Age indeterminate L1 compression fracture with approximately 30% loss of vertebral body height, new since the CT of 01/09/2021. Correlation with clinical exam and point tenderness recommended.  IMPRESSION: 1. No acute intrathoracic, abdominal, or pelvic pathology. 2. Fatty liver. 3. Age indeterminate L1 compression fracture, new since the CT of 01/09/2021. Correlation with clinical exam and point tenderness recommended. 4. Stable pulmonary nodules and scarring.  Electronically Signed   By: Vanetta Chou M.D.   On: 05/30/2024 13:47     Past Medical History:  Diagnosis Date   Hearing impaired    Left ear  Hyperopia     History reviewed. No pertinent surgical history.  History reviewed. No pertinent family history.  Prior to Admission medications   Medication Sig Start Date End Date Taking? Authorizing Provider  omeprazole  (PRILOSEC) 20 MG capsule Take 1 capsule (20 mg total) by mouth daily. 05/29/24  Yes Baker, Raguel MATSU, DO  carbamide peroxide (DEBROX) 6.5 % OTIC solution Place 5 drops into both ears 2 (two) times daily. Patient not taking: Reported on 05/30/2024 06/11/20   Austin Ade, MD  dicyclomine  (BENTYL ) 20 MG tablet Take 1 tablet (20 mg total) by mouth 2 (two) times daily. Patient not taking: Reported on 05/30/2024 01/09/21   Mesner, Selinda, MD    Current Facility-Administered Medications  Medication Dose Route Frequency Provider Last Rate Last Admin   enoxaparin  (LOVENOX ) injection 40 mg  40 mg Subcutaneous Q24H [name], DO       lactated ringers   infusion   Intravenous Continuous [name], DO       pantoprazole  (PROTONIX ) injection 40 mg  40 mg Intravenous Q12H [name], DO        Allergies as of 05/30/2024   (No Known Allergies)    Social History   Socioeconomic History   Marital status: Single    Spouse name: Not on file   Number of children: Not on file   Years of education: Not on file   Highest education level: Not on file  Occupational History   Not on file  Tobacco Use   Smoking status: Never    Passive exposure: Never   Smokeless tobacco: Never  Substance and Sexual Activity   Alcohol use: Never   Drug use: Never   Sexual activity: Yes    Partners: Female  Other Topics Concern   Not on file  Social History Narrative   ** Merged History Encounter **       Wife is Lydie Nyirandabaruta  . Refugee Information Number of Immediate Family Members: 6 Number of Immediate Family Members in [place] : 6 Date of Arrival: 03/26/20 Country of Birth: Other Other Country of Birth:: [employer] Country of Origin: Other Other    Country of Origin:: [facility] Doctor of Refugee Camp: Other Other Location of Refugee Camp:: [place] Duration in [place]: 16-19 years Reason for Leaving Home Country: Political opinion Primary Language: [place] Able to Read in Primary [place]   ge: Yes Able to Write in Primary Language: Yes Education: None Prior Work: Engineer, materials Marital Status: Married Brewing Technologist Labs Completed: Yes    Social Drivers of Corporate Investment Banker Strain: Not on file  Food Insecurity: Not on file  Transportation Needs: Not on file  Physical Activity: Not on file  Stress: Not on file  Social Connections: Not on file  Intimate Partner Violence: Not on file     Code Status   Code Status: Full Code  Review of Systems: All systems reviewed and negative except where noted in HPI.  Physical Exam: Vital signs in last 24 hours: Temp:  [98 F (36.7 C)-98.5 F (36.9 C)] 98 F (36.7  C) (11/26 1512) Pulse Rate:  [82-84] 82 (11/26 1512) Resp:  [16-18] 16 (11/26 1512) BP: (109-118)/(77) 109/77 (11/26 1512) SpO2:  [99 %] 99 % (11/26 1512) Weight:  [66 kg] 66 kg (11/26 1118)    General:  Pleasant male in NAD Psych:  Cooperative. Normal mood and affect Eyes: Pupils equal Ears:  Normal auditory acuity Nose: No deformity, discharge or lesions Neck:  Supple, no masses felt Lungs:  Clear to auscultation.  Heart:  Regular rate, regular rhythm.  Abdomen:  Soft, nondistended,  active bowel sounds, no masses felt.  Positive Carnett's sign (epigastric) Rectal :  Deferred Msk: Symmetrical without gross deformities.  Neurologic:  Alert, oriented, grossly normal neurologically Extremities : No edema Skin:  Intact without significant lesions.    Intake/Output from previous day: No intake/output data recorded. Intake/Output this shift:  No intake/output data recorded.   Vina Dasen, NP-C   05/30/2024, 3:56 PM  GI ATTENDING  History, laboratories, x-rays all personally reviewed.  Patient seen and examined as outlined above.  Agree with comprehensive consultation note as outlined the patient presents with acute hepatitis.  Etiology unclear.  Workup underway.  No evidence for acute hepatic failure, at least at this point.  With elevated liver test this high, likely etiologies are viral hepatitis and toxins.  Shock can do this, but no clinical history of such.  Continue with multiple supportive measures and follow-up testing. A total time of 80 minutes was spent reviewing data and preparing to see the patient, interviewing the patient with interpretive assistance, examining the patient, ordering studies, counseling and educating the patient and his son regarding his acute hepatitis, and documenting clinical information in the health record.  Norleen SAILOR. Abran Raddle., M.D. [facility] Division of Gastroenterology

## 2024-05-30 NOTE — Assessment & Plan Note (Signed)
 Cr elevated to 1.9 on admission. Suspect prerenal from poor PO intake.  - LR 100 mL/hr  - recheck BMP tomorrow morning

## 2024-05-30 NOTE — Assessment & Plan Note (Signed)
-   Admit to FMTS, attending Dr. Madelon - MedSurg, Vital signs per floor - Consults: GI - Protonix  40 mg IV BID  - F/u Hepatis panel  - Pain control: avoid tylenol , can consider opiates for pain control  - AM Labs: hepatic function panel, BMP

## 2024-05-30 NOTE — Plan of Care (Signed)

## 2024-05-30 NOTE — Assessment & Plan Note (Addendum)
 GERD Hearing impairment L ear

## 2024-05-31 ENCOUNTER — Inpatient Hospital Stay (HOSPITAL_COMMUNITY)

## 2024-05-31 DIAGNOSIS — B179 Acute viral hepatitis, unspecified: Secondary | ICD-10-CM

## 2024-05-31 DIAGNOSIS — R7401 Elevation of levels of liver transaminase levels: Secondary | ICD-10-CM | POA: Diagnosis not present

## 2024-05-31 DIAGNOSIS — R7989 Other specified abnormal findings of blood chemistry: Secondary | ICD-10-CM

## 2024-05-31 LAB — CBC WITH DIFFERENTIAL/PLATELET
Abs Immature Granulocytes: 0 K/uL (ref 0.00–0.07)
Basophils Absolute: 0 x10E3/uL (ref 0.0–0.2)
Basophils Absolute: 0 K/uL (ref 0.0–0.1)
Basophils Relative: 2 %
Basos: 0 %
EOS (ABSOLUTE): 0 x10E3/uL (ref 0.0–0.4)
Eos: 1 %
Eosinophils Absolute: 0.2 K/uL (ref 0.0–0.5)
Eosinophils Relative: 7 %
HCT: 33.8 % — ABNORMAL LOW (ref 39.0–52.0)
Hematocrit: 35.1 % — ABNORMAL LOW (ref 37.5–51.0)
Hemoglobin: 11.9 g/dL — ABNORMAL LOW (ref 13.0–17.7)
Hemoglobin: 11.4 g/dL — ABNORMAL LOW (ref 13.0–17.0)
Immature Grans (Abs): 0 x10E3/uL (ref 0.0–0.1)
Immature Granulocytes: 0 %
Immature Granulocytes: 0 %
Lymphocytes Absolute: 0.7 x10E3/uL (ref 0.7–3.1)
Lymphocytes Relative: 31 %
Lymphs Abs: 0.7 K/uL (ref 0.7–4.0)
Lymphs: 9 %
MCH: 33.2 pg — ABNORMAL HIGH (ref 26.6–33.0)
MCH: 33.4 pg (ref 26.0–34.0)
MCHC: 33.9 g/dL (ref 31.5–35.7)
MCHC: 33.7 g/dL (ref 30.0–36.0)
MCV: 98 fL — ABNORMAL HIGH (ref 79–97)
MCV: 99.1 fL (ref 80.0–100.0)
Monocytes Absolute: 0.4 x10E3/uL (ref 0.1–0.9)
Monocytes Absolute: 0.3 K/uL (ref 0.1–1.0)
Monocytes Relative: 11 %
Monocytes: 6 %
Neutro Abs: 1.2 K/uL — ABNORMAL LOW (ref 1.7–7.7)
Neutrophils Absolute: 6.1 x10E3/uL (ref 1.4–7.0)
Neutrophils Relative %: 49 %
Neutrophils: 84 %
Platelets: 186 x10E3/uL (ref 150–450)
Platelets: 129 K/uL — ABNORMAL LOW (ref 150–400)
RBC: 3.58 x10E6/uL — ABNORMAL LOW (ref 4.14–5.80)
RBC: 3.41 MIL/uL — ABNORMAL LOW (ref 4.22–5.81)
RDW: 13.3 % (ref 11.6–15.4)
RDW: 13.3 % (ref 11.5–15.5)
WBC: 7.3 x10E3/uL (ref 3.4–10.8)
WBC: 2.4 K/uL — ABNORMAL LOW (ref 4.0–10.5)
nRBC: 0 % (ref 0.0–0.2)

## 2024-05-31 LAB — BASIC METABOLIC PANEL WITH GFR
Anion gap: 8 (ref 5–15)
BUN: 18 mg/dL (ref 8–23)
CO2: 26 mmol/L (ref 22–32)
Calcium: 7.8 mg/dL — ABNORMAL LOW (ref 8.9–10.3)
Chloride: 102 mmol/L (ref 98–111)
Creatinine, Ser: 1.47 mg/dL — ABNORMAL HIGH (ref 0.61–1.24)
GFR, Estimated: 50 mL/min — ABNORMAL LOW (ref >60)
Glucose, Bld: 97 mg/dL (ref 70–99)
Potassium: 4.7 mmol/L (ref 3.5–5.1)
Sodium: 136 mmol/L (ref 135–145)

## 2024-05-31 LAB — IRON AND TIBC
Iron: 139 ug/dL (ref 45–182)
Saturation Ratios: 94 % — ABNORMAL HIGH (ref 17.9–39.5)
TIBC: 148 ug/dL — ABNORMAL LOW (ref 250–450)
UIBC: 9 ug/dL

## 2024-05-31 LAB — BRAIN NATRIURETIC PEPTIDE: B Natriuretic Peptide: 107.7 pg/mL — ABNORMAL HIGH (ref 0.0–100.0)

## 2024-05-31 LAB — HEPATIC FUNCTION PANEL
ALT: 1030 U/L — ABNORMAL HIGH (ref 0–44)
AST: 1022 U/L — ABNORMAL HIGH (ref 15–41)
Albumin: 2.4 g/dL — ABNORMAL LOW (ref 3.5–5.0)
Alkaline Phosphatase: 90 U/L (ref 38–126)
Bilirubin, Direct: 1.2 mg/dL — ABNORMAL HIGH (ref 0.0–0.2)
Indirect Bilirubin: 1.2 mg/dL — ABNORMAL HIGH (ref 0.3–0.9)
Total Bilirubin: 2.4 mg/dL — ABNORMAL HIGH (ref 0.0–1.2)
Total Protein: 6.4 g/dL — ABNORMAL LOW (ref 6.5–8.1)

## 2024-05-31 LAB — COMPREHENSIVE METABOLIC PANEL WITH GFR
ALT: 2444 IU/L (ref 0–44)
AST: 5578 IU/L (ref 0–40)
Albumin: 3.3 g/dL — ABNORMAL LOW (ref 3.8–4.8)
Alkaline Phosphatase: 122 IU/L (ref 47–123)
BUN/Creatinine Ratio: 16 (ref 10–24)
BUN: 32 mg/dL — ABNORMAL HIGH (ref 8–27)
Bilirubin Total: 1.4 mg/dL — ABNORMAL HIGH (ref 0.0–1.2)
CO2: 22 mmol/L (ref 20–29)
Calcium: 7.8 mg/dL — ABNORMAL LOW (ref 8.6–10.2)
Chloride: 96 mmol/L (ref 96–106)
Creatinine, Ser: 1.94 mg/dL — ABNORMAL HIGH (ref 0.76–1.27)
Globulin, Total: 4.2 g/dL (ref 1.5–4.5)
Glucose: 122 mg/dL — ABNORMAL HIGH (ref 70–99)
Potassium: 4.8 mmol/L (ref 3.5–5.2)
Sodium: 134 mmol/L (ref 134–144)
Total Protein: 7.5 g/dL (ref 6.0–8.5)
eGFR: 36 mL/min/1.73 — ABNORMAL LOW (ref >59)

## 2024-05-31 LAB — FERRITIN: Ferritin: 2655 ng/mL — ABNORMAL HIGH (ref 24–336)

## 2024-05-31 LAB — PROTIME-INR
INR: 1.3 — ABNORMAL HIGH (ref 0.8–1.2)
Prothrombin Time: 17.1 s — ABNORMAL HIGH (ref 11.4–15.2)

## 2024-05-31 LAB — SYPHILIS: RPR W/REFLEX TO RPR TITER AND TREPONEMAL ANTIBODIES, TRADITIONAL SCREENING AND DIAGNOSIS ALGORITHM: RPR Ser Ql: NONREACTIVE

## 2024-05-31 LAB — HEPATITIS B SURFACE ANTIGEN: Hepatitis B Surface Ag: NEGATIVE

## 2024-05-31 LAB — URINE CULTURE: Organism ID, Bacteria: NO GROWTH

## 2024-05-31 LAB — HCV AB W REFLEX TO QUANT PCR: HCV Ab: NONREACTIVE

## 2024-05-31 LAB — HIV ANTIBODY (ROUTINE TESTING W REFLEX): HIV Screen 4th Generation wRfx: NONREACTIVE

## 2024-05-31 LAB — HCV INTERPRETATION

## 2024-05-31 LAB — LACTATE DEHYDROGENASE: LDH: 1519 IU/L (ref 121–224)

## 2024-05-31 LAB — VITAMIN B12: Vitamin B-12: 916 pg/mL — ABNORMAL HIGH (ref 180–914)

## 2024-05-31 LAB — H. PYLORI BREATH TEST: H pylori Breath Test: POSITIVE — AB

## 2024-05-31 NOTE — Assessment & Plan Note (Addendum)
 LFTs downtrending, Hepatitis panel negative.  - Consults: GI - Protonix  40 mg IV BID  - Pain control: avoid tylenol , oxycodone  2.5 mg q6h PRN   - Daily hepatic function panel, BMP - Consult RD for nutritional assessment 11/28

## 2024-05-31 NOTE — Assessment & Plan Note (Signed)
 Improving. Suspect prerenal from poor PO intake.  - LR 100 mL/hr  - Daily BMP  - Avoid Nephrotoxic agents

## 2024-05-31 NOTE — Plan of Care (Signed)
  Problem: Clinical Measurements: Goal: Ability to maintain clinical measurements within normal limits will improve Outcome: Progressing Goal: Diagnostic test results will improve Outcome: Progressing   Problem: Activity: Goal: Risk for activity intolerance will decrease Outcome: Progressing   Problem: Nutrition: Goal: Adequate nutrition will be maintained Outcome: Progressing   Problem: Safety: Goal: Ability to remain free from injury will improve Outcome: Progressing

## 2024-05-31 NOTE — Progress Notes (Addendum)
 [facility] Progress Note  CC:  Abdominal pain, transaminitis   Subjective: I was unable to connect with a [employer] [place] video or audio interpretor at this time. His son just walked into his room and he speaks some English.  He was able to relay questions to his father, patient denied having any chest pain, shortness of breath or abdominal pain.  Radiology transporter at the bedside to take patient down for a thyroid  ultrasound at this time.   Objective:  Vital signs in last 24 hours: Temp:  [97.6 F (36.4 C)-98.2 F (36.8 C)] 97.8 F (36.6 C) (11/27 0735) Pulse Rate:  [66-82] 69 (11/27 0735) Resp:  [16-19] 16 (11/27 0735) BP: (92-124)/(67-84) 124/70 (11/27 0735) SpO2:  [99 %-100 %] 99 % (11/27 0735) Last BM Date : 05/29/24 General: Alert 73 year old male in no acute distress. Eyes: No scleral icterus.  Heart: Regular rate and rhythm, no murmurs. Pulm: Breath sounds clear.  On room air. Abdomen: Soft, nondistended.  Nontender.  Positive bowel sounds all 4 quadrants.  No hepatosplenomegaly.  No palpable mass. Extremities: No lower extremity edema. Neurologic:  Alert and oriented.  Speech is clear.  Moves all extremities equally. Psych:  Alert and cooperative. Normal mood and affect.  Intake/Output from previous day: 11/26 0701 - 11/27 0700 In: 240 [P.O.:240] Out: -  Intake/Output this shift: Total I/O In: 240 [P.O.:240] Out: -   Lab Results: Recent Labs    05/30/24 1121 05/30/24 1123 05/31/24 0441  WBC 3.1* 3.3* 2.4*  HGB 11.9* 11.8* 11.4*  HCT 33.7* 34.6* 33.8*  PLT 153 154 129*   BMET Recent Labs    05/29/24 1042 05/30/24 1121 05/31/24 0441  NA 134 135 136  K 4.8 4.5 4.7  CL 96 103 102  CO2 22 22 26   GLUCOSE 122* 132* 97  BUN 32* 24* 18  CREATININE 1.94* 1.90* 1.47*  CALCIUM 7.8* 7.7* 7.8*   LFT Recent Labs    05/31/24 0441  PROT 6.4*  ALBUMIN 2.4*  AST 1,022*  ALT 1,030*  ALKPHOS 90  BILITOT 2.4*  BILIDIR 1.2*   IBILI 1.2*   PT/INR Recent Labs    05/31/24 0441  LABPROT 17.1*  INR 1.3*   Hepatitis Panel Recent Labs    05/30/24 1536  HEPBSAG NON REACTIVE  HCVAB NON REACTIVE  HEPAIGM NON REACTIVE  HEPBIGM NON REACTIVE    CT CHEST ABDOMEN PELVIS WO CONTRAST Result Date: 05/30/2024 CLINICAL DATA:  Elevated LFTs.  Lung nodule seen on the prior CT. EXAM: CT CHEST, ABDOMEN AND PELVIS WITHOUT CONTRAST TECHNIQUE: Multidetector CT imaging of the chest, abdomen and pelvis was performed following the standard protocol without IV contrast. RADIATION DOSE REDUCTION: This exam was performed according to the departmental dose-optimization program which includes automated exposure control, adjustment of the mA and/or kV according to patient size and/or use of iterative reconstruction technique. COMPARISON:  Chest CT dated 08/12/2020 and CT abdomen pelvis dated 01/09/2021. FINDINGS: Evaluation of this exam is limited in the absence of intravenous contrast. CT CHEST FINDINGS Cardiovascular: There is no cardiomegaly or pericardial effusion. The thoracic aorta and central pulmonary arteries are grossly unremarkable. Mediastinum/Nodes: No hilar or mediastinal adenopathy. The esophagus is grossly unremarkable. No mediastinal fluid collection. Lungs/Pleura: Stable area of nodular scarring in the subpleural lateral right lung base measuring 7 mm. Additional linear nodular scarring in the right upper lobe as seen on the prior CT. Similar appearance of a 4 mm right middle lobe nodule along the minor fissure.  No focal consolidation, pleural effusion, or pneumothorax. The central airways are patent. Musculoskeletal: Osteopenia.  No acute osseous pathology. CT ABDOMEN PELVIS FINDINGS No intra-abdominal free air.  Small free fluid in the pelvis. Hepatobiliary: Fatty liver. No biliary dilatation. The gallbladder is unremarkable. Pancreas: Unremarkable. No pancreatic ductal dilatation or surrounding inflammatory changes. Spleen:  Normal in size without focal abnormality. Adrenals/Urinary Tract: The adrenal glands unremarkable there is no hydronephrosis or nephrolithiasis on either side. The visualized ureters and urinary bladder unremarkable. Stomach/Bowel: There is moderate stool throughout the colon. There is no bowel obstruction or active inflammation. The appendix is normal. Vascular/Lymphatic: The abdominal aorta and IVC unremarkable on this noncontrast CT. No portal venous gas. There is no adenopathy. Reproductive: The prostate and seminal vesicles are grossly unremarkable. Other: None Musculoskeletal: Osteopenia with degenerative changes of the spine. Age indeterminate L1 compression fracture with approximately 30% loss of vertebral body height, new since the CT of 01/09/2021. Correlation with clinical exam and point tenderness recommended. IMPRESSION: 1. No acute intrathoracic, abdominal, or pelvic pathology. 2. Fatty liver. 3. Age indeterminate L1 compression fracture, new since the CT of 01/09/2021. Correlation with clinical exam and point tenderness recommended. 4. Stable pulmonary nodules and scarring. Electronically Signed   By: Vanetta Chou M.D.   On: 05/30/2024 13:47    Assessment / Plan:  73 year old male, non-English-speaking from [place] (speaks [place]) admitted 11/26 with N/V, upper abdominal pain and weight loss with acute transaminitis. T. Bili 1.4 -> 3.2. Alk phos 122 -> 94. AST 5,578 -> 1, 915 -> 1,022.  ALT 2,444, 1,381 -> 1,030. Acute hepatitis panel negative. CK1 116.  Iron 139.  Ferritin 2,655. HIV negative. Prior Hep B core total antibody positive 05/2020 which indicates past exposure vs past Hep B infection. Non-contrast CTAP identified a fatty liver without biliary ductal dilatation and the gallbladder was unremarkable.  Hemodynamically stable. - Hep B DNA quant added to earlier a.m. lab draw, rule out reactivation of Hep B  - Hep D antibody in am (send out lab), if positive will require Hep D RNA  level - CMV IgM and EBV IgM in am  - Daily PT/INR - Regular diet   H. Pylori breath test per PCP 05/29/2024 resulted positive - Defer H. Pylori treatment for now due to elevated LFTs  AKI  Mild normocytic anemia. Hg 11.9.  No overt GI bleeding.  TSH 0.171 consistent with hyperthyroidism - Thyroid  ultrasound ordered per the medical service  Stable pulmonary nodules per CT  L1 compression fracture per CT   Principal Problem:   Transaminitis Active Problems:   Elevated liver enzymes   Abdominal pain   Chronic health problem   AKI (acute kidney injury)   Abnormal LFTs   Acute hepatitis     LOS: 1 day   Elida HERO Kennedy-Smith  05/31/2024, 3:29 PM  GI ATTENDING Agree with above without addition or deletion. Will follow.   Norleen SAILOR. Abran Raddle., M.D. [facility] Division of Gastroenterology

## 2024-05-31 NOTE — Progress Notes (Signed)
     Daily Progress Note Intern Pager: [phone]  Patient name: Jeremy Rivas Medical record number: [id number] Date of birth: October 23, 1950 Age: 73 y.o. Gender: male  Primary Care Provider: Lennie Raguel MATSU, DO Consultants: GI Code Status: Full code  Pt Overview and Major Events to Date:  11/26: Admitted   Medical Decision Making:  [name] is a 73 y.o. male admitted for elevated LFTs in the setting unintentional weight loss.   Normal CT chest, AP. Negative hepatitis panel. INR noted to be elevated to 1.3. BNP mildly elevated. H Pylori breath test noted to be positive. Assessment & Plan Transaminitis Elevated liver enzymes Abdominal pain LFTs downtrending, Hepatitis panel negative.  - Consults: GI - Protonix  40 mg IV BID  - Pain control: avoid tylenol , oxycodone  2.5 mg q6h PRN   - Daily hepatic function panel, BMP - Consult RD for nutritional assessment 11/28 AKI (acute kidney injury) Improving. Suspect prerenal from poor PO intake.  - LR 100 mL/hr  - Daily BMP  - Avoid Nephrotoxic agents  Chronic health problem GERD Hearing impairment L ear  FEN/GI: Regular  PPx: Lovenox   Dispo:Home pending clinical improvement . Barriers include ongoing medical workup.   Subjective:  Son at bedside. They are ready to go home today. He is tolerating oral diet.   Objective: Temp:  [97.6 F (36.4 C)-98.5 F (36.9 C)] 97.8 F (36.6 C) (11/27 0735) Pulse Rate:  [66-84] 69 (11/27 0735) Resp:  [16-19] 16 (11/27 0735) BP: (92-124)/(67-84) 124/70 (11/27 0735) SpO2:  [99 %-100 %] 99 % (11/27 0735) Weight:  [66 kg] 66 kg (11/26 1118) Physical Exam: General: Chronically ill-appearing, no acute distress Cardio: RRR, no murmur on exam Pulm: clear, No increased work of breathing Abdomen: Soft, bowel sounds present, nontender Extremity: No peripheral edema  Laboratory: Most recent CBC Lab Results  Component Value Date   WBC 2.4 (L) 05/31/2024   HGB 11.4 (L) 05/31/2024    HCT 33.8 (L) 05/31/2024   MCV 99.1 05/31/2024   PLT 129 (L) 05/31/2024   Most recent BMP    Latest Ref Rng & Units 05/31/2024    4:41 AM  BMP  Glucose 70 - 99 mg/dL 97   BUN 8 - 23 mg/dL 18   Creatinine 9.38 - 1.24 mg/dL 8.52   Sodium 864 - 854 mmol/L 136   Potassium 3.5 - 5.1 mmol/L 4.7   Chloride 98 - 111 mmol/L 102   CO2 22 - 32 mmol/L 26   Calcium 8.9 - 10.3 mg/dL 7.8     BNP 892 Ferritin 2,655 INR 1.3  Imaging/Diagnostic Tests: No new imaging.   [name], DO 05/31/2024, 7:52 AM  PGY-3, Jeremy Rivas [facility] FPTS Intern pager: [phone], text pages welcome Secure chat group [facility] Teaching Service

## 2024-05-31 NOTE — Plan of Care (Signed)

## 2024-05-31 NOTE — Assessment & Plan Note (Signed)
 GERD Hearing impairment L ear

## 2024-06-01 ENCOUNTER — Telehealth: Payer: Self-pay | Admitting: Nurse Practitioner

## 2024-06-01 ENCOUNTER — Ambulatory Visit: Payer: Self-pay

## 2024-06-01 DIAGNOSIS — R7401 Elevation of levels of liver transaminase levels: Secondary | ICD-10-CM | POA: Diagnosis not present

## 2024-06-01 DIAGNOSIS — R748 Abnormal levels of other serum enzymes: Secondary | ICD-10-CM

## 2024-06-01 DIAGNOSIS — B179 Acute viral hepatitis, unspecified: Secondary | ICD-10-CM

## 2024-06-01 LAB — HAPTOGLOBIN: Haptoglobin: 42 mg/dL (ref 34–355)

## 2024-06-01 NOTE — Telephone Encounter (Signed)
 Linda/Dottie: Please obtain Jeremy Rivas interpretor and contact the patient to facilitate labs to include a CBC and CMP in one week and schedule patient for a follow up appointment with me in 3 to 4 weeks. Patient was discharged home from the hospital today. THX.

## 2024-06-01 NOTE — Discharge Summary (Addendum)
 Family Medicine Teaching [facility] Discharge Summary  Patient name: Jeremy Rivas Medical record number: [id number] Date of birth: 1950/12/20 Age: 73 y.o. Gender: male Date of Admission: 05/30/2024  Date of Discharge: 06/01/24  Admitting Physician: Damien Pinal, DO  Primary Care Provider: Lennie Raguel MATSU, DO Consultants: GI  Indication for Hospitalization: Elevated LFTs  Discharge Diagnoses/Problem List:  Principal Problem for Admission:  Transaminitis Active Problems:   Abdominal pain   AKI (acute kidney injury)   Abnormal LFTs   Acute hepatitis   Brief Hospital Course:  Jeremy Rivas is a 73 y.o. male [place] speaking refugee with history of mediastinal mass, strongyloides infection, who was admitted for abdominal pain and found to have transaminitis with associated AKI.  Abdominal pain Elevated liver enzymes Patient presenting with 1 month of abdominal pain, found to have significant elevated liver enzymes (AST 5000, ALT 2000).  CT chest, abdomen, pelvis without contrast unremarkable, however patient had rising bilirubin from 1.4-3.2 in 24 hours.  Hepatitis panel was obtained which showed Hep A/B/C nonreactive, and hemolysis workup showed elevated ferritin of 2,655.  GI was consulted, who recommended further lab workup which patient declined due to us  taking too much blood from him. He will need outpatient follow up with GI for continued workup and management in 3-4 weeks.   Acute kidney injury Elevated serum creatinine of 1.9, baseline of 0.6-0.7.  Patient initiated on IV fluids, with improvement of Cr prior to discharge. Unfortunately patient declined further labs on day of discharge.  Other chronic conditions were medically managed with home medications and formulary alternatives as necessary (GERD, L sided hearing impairment)  Follow-up recommendations Recheck hepatic function panel  Obtain CMV IgM, Ebstein-Barr virus VCA, IgM, Hepatitis D antibiotics  IgM and IgG, PT-INR Start treatment for H Pylori once LFTs normalize.  Spine exam - L1 compression fx on CT. Follow up outpatient. Asymptomatic.     Results/Tests Pending at Time of Discharge:  Unresulted Labs (From admission, onward)     Start     Ordered   06/01/24 0500  Hepatic function panel  Tomorrow morning,   R        05/31/24 1413   06/01/24 0500  Basic metabolic panel with GFR  Tomorrow morning,   R        05/31/24 1414   06/01/24 0500  CBC with Differential/Platelet  Tomorrow morning,   R        05/31/24 1414   06/01/24 0500  CMV IgM  Tomorrow morning,   R        05/31/24 1530   06/01/24 0500  Miscellaneous [facility] test (send-out)  Tomorrow morning,   R       Question:  Test name / description:  Answer:  Hepatitis D antibody IgM and IgG. TEST # [id number]. CPT 13307.   05/31/24 1540   06/01/24 0500  Protime-INR  Daily,   R      05/31/24 1557   05/31/24 1531  Epstein-Barr virus VCA, IgM  Once,   R        05/31/24 1530   05/31/24 0543  Hepatitis B DNA, ultraquantitative, PCR  Add-on,   AD       Comments: Add on to earlier am lab draw    05/31/24 0543   05/31/24 0500  Haptoglobin  Tomorrow morning,   R        05/30/24 1647   05/31/24 0500  Aldolase  Once,   R  05/31/24 0500             Disposition: home  Discharge Condition: stable  Discharge Exam:  Vitals:   06/01/24 0454 06/01/24 0739  BP: 126/77 118/69  Pulse: 78 78  Resp: 16 17  Temp: 98.4 F (36.9 C) 98.2 F (36.8 C)  SpO2: 100% 100%   General: Well-appearing, no acute distress Cardio: RRR, no murmur on exam Pulm: clear, No increased work of breathing Abdomen: Soft, bowel sounds present, nontender Extremity: No peripheral edema   Significant Procedures: None   Significant Labs and Imaging:  Recent Labs  Lab 05/30/24 1121 05/30/24 1123 05/31/24 0441  WBC 3.1* 3.3* 2.4*  HGB 11.9* 11.8* 11.4*  HCT 33.7* 34.6* 33.8*  PLT 153 154 129*   Recent Labs  Lab 05/30/24 1121  05/31/24 0441  NA 135 136  K 4.5 4.7  CL 103 102  CO2 22 26  GLUCOSE 132* 97  BUN 24* 18  CREATININE 1.90* 1.47*  CALCIUM 7.7* 7.8*  ALKPHOS 94 90  AST 1,915* 1,022*  ALT 1,381* 1,030*  ALBUMIN 2.7* 2.4*    CT Chest, Abdomen, pelvis:  IMPRESSION: 1. No acute intrathoracic, abdominal, or pelvic pathology. 2. Fatty liver. 3. Age indeterminate L1 compression fracture, new since the CT of 01/09/2021. Correlation with clinical exam and point tenderness recommended. 4. Stable pulmonary nodules and scarring.  US  Thyroid :  IMPRESSION: Solitary 1.9 cm TI-RADS category 4 nodule in the left upper gland meets criteria to consider fine-needle aspiration biopsy. Biopsy is recommended.   Discharge Medications:  Allergies as of 06/01/2024   No Known Allergies      Medication List     STOP taking these medications    Debrox 6.5 % OTIC solution Generic drug: carbamide peroxide   dicyclomine  20 MG tablet Commonly known as: BENTYL        TAKE these medications    omeprazole  20 MG capsule Commonly known as: PRILOSEC Take 1 capsule (20 mg total) by mouth daily.        Discharge Instructions: Please refer to Patient Instructions section of EMR for full details.  Patient was counseled important signs and symptoms that should prompt return to medical care, changes in medications, dietary instructions, activity restrictions, and follow up appointments.   Follow-Up Appointments:   [name], DO 06/01/2024, 9:27 AM PGY-3, [facility]

## 2024-06-01 NOTE — Discharge Instructions (Addendum)
 Dear Jeremy Rivas,  Thank you for letting us  participate in your care. Please follow up in our office as scheduled on December 2, at 8:50 am. Continue your medications as prescribed at home.   Urakoze kutwemerera kugira uruhare mukwitaho. Nyamuneka mukurikirane mu biro byacu nkuko biteganijwe ku ya 2 Ukuboza, saa 8h50. Komeza imiti yawe nkuko byateganijwe murugo.   Ni ngombwa cyane ko ukurikirana kugirango ukomeze kugerageza no kumenya impamvu umwijima wawe wazamutse cyane.  DOCTOR'S APPOINTMENT   Future Appointments  Date Time Provider Department Center  06/05/2024  8:50 AM Cleotilde Lukes, DO [facility]     Take care and be well!  Family Medicine Teaching Service Inpatient Team [place]  [facility]  [address] [place], [place] [address] [phone]

## 2024-06-01 NOTE — Plan of Care (Signed)
  Problem: Health Behavior/Discharge Planning: Goal: Ability to manage health-related needs will improve Outcome: Progressing   Problem: Clinical Measurements: Goal: Cardiovascular complication will be avoided Outcome: Progressing   Problem: Activity: Goal: Risk for activity intolerance will decrease Outcome: Progressing   

## 2024-06-01 NOTE — Telephone Encounter (Signed)
 Patient was notified about her lab results and asked to go to ED for admission RE: further work up

## 2024-06-01 NOTE — Progress Notes (Signed)
 Patient and son have received his discharge papers and now requesting a Dr.'s Note for work. Awaiting for MD response.

## 2024-06-01 NOTE — Progress Notes (Signed)
 Pt refused lab work this morning.  Discussed with pt and his son via audio interpreter.  He is concerned about the amount of blood being drawn each day and that he is feeling weak.  He stated that he is no longer sick and wants to go home.  Pt stated that he would wait for the doctor to come this morning to be discharged. Glade Lee BSN RN CMSRN 06/01/2024, 5:29 AM

## 2024-06-02 LAB — HEPATITIS B DNA, ULTRAQUANTITATIVE, PCR
HBV DNA SERPL PCR-ACNC: NOT DETECTED [IU]/mL
HBV DNA SERPL PCR-LOG IU: UNDETERMINED {Log_IU}/mL

## 2024-06-02 LAB — ALDOLASE: Aldolase: 63 U/L — ABNORMAL HIGH (ref 3.3–10.3)

## 2024-06-03 ENCOUNTER — Ambulatory Visit: Payer: Self-pay | Admitting: Nurse Practitioner

## 2024-06-04 ENCOUNTER — Inpatient Hospital Stay: Payer: Self-pay | Admitting: Family Medicine

## 2024-06-04 NOTE — Telephone Encounter (Signed)
 With the help of [facility]) interpreter, ID [phone], I have left a message requesting patient call us  back.

## 2024-06-04 NOTE — Addendum Note (Signed)
 Addended by: CLAUDENE NAOMIE SAILOR on: 06/04/2024 01:27 PM   Modules accepted: Orders

## 2024-06-04 NOTE — Telephone Encounter (Signed)
 Patient is scheduled to see Elida Nyle Sharps, NP on 07/06/24 at 1:30 pm.  Labs entered in EPIC to be completed 06/11/24.

## 2024-06-05 ENCOUNTER — Ambulatory Visit: Payer: Self-pay | Admitting: Family Medicine

## 2024-06-05 ENCOUNTER — Telehealth: Payer: Self-pay

## 2024-06-05 NOTE — Telephone Encounter (Signed)
 [facility]) interpreter, [place] ID [phone] attempted to reach patient but go no answer and voicemail is not available.

## 2024-06-05 NOTE — Telephone Encounter (Signed)
 Son Camellia called with appointment reminder.  Naomie Srinika Delone RN BSN PCCN  [facility] [phone]-cell [phone]-office

## 2024-06-06 ENCOUNTER — Ambulatory Visit: Admitting: Family Medicine

## 2024-06-06 ENCOUNTER — Encounter: Payer: Self-pay | Admitting: Family Medicine

## 2024-06-06 VITALS — BP 133/76 | HR 83 | Ht 70.0 in | Wt 154.1 lb

## 2024-06-06 DIAGNOSIS — R748 Abnormal levels of other serum enzymes: Secondary | ICD-10-CM | POA: Diagnosis not present

## 2024-06-06 DIAGNOSIS — Z599 Problem related to housing and economic circumstances, unspecified: Secondary | ICD-10-CM

## 2024-06-06 DIAGNOSIS — R7989 Other specified abnormal findings of blood chemistry: Secondary | ICD-10-CM | POA: Diagnosis not present

## 2024-06-06 NOTE — Telephone Encounter (Signed)
 Using [facility]) interpreter, [place], [place] [phone], I attempted to reach patient at the number provided for his spouse through EPIC (2 previous attempts to reach patient at his number unsuccessful). Unfortunately, the number called is not in service. I will send a letter to patient's home address in hopes of getting patient to call our office back.

## 2024-06-06 NOTE — Assessment & Plan Note (Addendum)
 Hospital follow-up for acute hepatitis of unknown cause. Reevaluate liver function and other possible causes of acute hepatitis.  Symptomatically remains stable, normal exam. - Ordered CMV IgM, Ebstein-Barr virus VCA, IgM, Hepatitis D antibiotics IgM and IgG, PT-INR

## 2024-06-06 NOTE — Patient Instructions (Addendum)
 It was great to see you! Thank you for allowing me to participate in your care!  Our plans for today:   VISIT SUMMARY: You had a follow-up visit after your recent hospitalization for liver and kidney function issues. Your liver function has normalized, and you have no current abdominal pain. You are currently unable to perform heavy lifting and need a work note for light duty. You have a gastroenterology appointment scheduled for January 2nd.  YOUR PLAN: ABNORMAL LIVER FUNCTION TESTS: Your liver function tests were abnormal during your recent hospitalization. -We have ordered new liver function tests. -We will discuss the results at your follow-up appointment.  ABNORMAL KIDNEY FUNCTION TESTS: Your kidney function tests were elevated during your recent hospitalization. -We have ordered new kidney function tests. -We will discuss the results at your follow-up appointment.  FUNCTIONAL STATUS AND OCCUPATIONAL LIMITATIONS: You are currently unable to perform heavy lifting and need a work note for light duty. -Your employer's HR department requires you to be absent from work until December 11th due to physical limitations. -We will provide a work note for light duty to facilitate your return to work.  SPECIALTY FOLLOW-UP: You have a follow-up appointment with gastroenterology. -Your gastroenterology appointment is scheduled for January 2nd.   Please arrive 15 minutes PRIOR to your next scheduled appointment time! If you do not, this affects OTHER patients' care.  Take care and seek immediate care sooner if you develop any concerns.   Jeremy Provencal, MD, PGY-3 [facility] 9:07 AM 06/06/2024  [facility]

## 2024-06-06 NOTE — Progress Notes (Signed)
    SUBJECTIVE:   CHIEF COMPLAINT / HPI: hospital f/u  Discussed the use of AI scribe software for clinical note transcription with the patient, who gave verbal consent to proceed.  History of Present Illness Jeremy Rivas is a 73 year old male who presents for follow-up after recent hospitalization for liver and kidney function abnormalities.  Hepatic and renal function abnormalities - Recent hospitalization for abnormal liver and kidney function tests - Daily monitoring of liver and kidney function during admission - Liver function normalized prior to discharge - No current abdominal pain - No medications prescribed at discharge  Functional status and occupational limitations - Unable to perform heavy lifting since hospitalization - Employer's HR department requires absence from work until December 11th due to physical limitations - Requires work note for light duty to facilitate return to work  Specialty follow-up - Upcoming gastroenterology appointment scheduled for January 2nd following inpatient consultation  Recheck hepatic function panel  Obtain CMV IgM, Ebstein-Barr virus VCA, IgM, Hepatitis D antibiotics IgM and IgG, PT-INR Start treatment for H Pylori once LFTs normalize.  Spine exam - L1 compression fx on CT. Follow up outpatient. Asymptomatic.   PERTINENT  PMH / PSH: mediastinal mass, strongyloides infection   OBJECTIVE:   BP 133/76   Pulse 83   Ht 5' 10 (1.778 m)   Wt 154 lb 2 oz (69.9 kg)   SpO2 100%   BMI 22.11 kg/m   Physical Exam General: NAD, well appearing, no scleral icterus or jaundice Neuro: A&O Cardiovascular: RRR, no murmurs,  Respiratory: normal WOB on RA, CTAB, no wheezes, ronchi or rales Abdomen: soft, NTTP, no rebound or guarding Extremities: Moving all 4 extremities equally, no peripheral edema   ASSESSMENT/PLAN:   Assessment & Plan Elevated liver enzymes Hospital follow-up for acute hepatitis of unknown cause. Reevaluate liver  function and other possible causes of acute hepatitis.  Symptomatically remains stable, normal exam. - Ordered CMV IgM, Ebstein-Barr virus VCA, IgM, Hepatitis D antibiotics IgM and IgG, PT-INR Elevated serum creatinine Resolving AKI at discharge.  Ensure that creatine has continued to resolve. - BMP today Financial difficulties Son notes that patient has not had any medicine since leaving hospital as he has not been approved for medicaid, but was discussing with social worker in the hospital.  - [facility] referral    Return in about 2 weeks (around 06/20/2024).  Jeremy Provencal, MD, PGY-3 [facility] 9:05 AM 06/06/2024  [facility]

## 2024-06-07 LAB — PROTIME-INR
INR: 1.1 (ref 0.9–1.2)
Prothrombin Time: 11.5 s (ref 9.1–12.0)

## 2024-06-07 LAB — INTERPRETATION:

## 2024-06-07 LAB — HEPATIC FUNCTION PANEL
ALT: 201 IU/L — ABNORMAL HIGH (ref 0–44)
AST: 94 IU/L — ABNORMAL HIGH (ref 0–40)
Albumin: 3.2 g/dL — ABNORMAL LOW (ref 3.8–4.8)
Alkaline Phosphatase: 101 IU/L (ref 47–123)
Bilirubin Total: 0.6 mg/dL (ref 0.0–1.2)
Bilirubin, Direct: 0.31 mg/dL (ref 0.00–0.40)
Total Protein: 6.9 g/dL (ref 6.0–8.5)

## 2024-06-07 LAB — BASIC METABOLIC PANEL WITH GFR
BUN/Creatinine Ratio: 9 — ABNORMAL LOW (ref 10–24)
BUN: 12 mg/dL (ref 8–27)
CO2: 24 mmol/L (ref 20–29)
Calcium: 7.7 mg/dL — ABNORMAL LOW (ref 8.6–10.2)
Chloride: 98 mmol/L (ref 96–106)
Creatinine, Ser: 1.34 mg/dL — ABNORMAL HIGH (ref 0.76–1.27)
Glucose: 83 mg/dL (ref 70–99)
Potassium: 4.1 mmol/L (ref 3.5–5.2)
Sodium: 136 mmol/L (ref 134–144)
eGFR: 56 mL/min/1.73 — ABNORMAL LOW (ref >59)

## 2024-06-07 LAB — EPSTEIN-BARR VIRUS (EBV) ANTIBODY PROFILE
EBV NA IgG: 87.5 U/mL — ABNORMAL HIGH (ref 0.0–17.9)
EBV VCA IgG: 63.5 U/mL — ABNORMAL HIGH (ref 0.0–17.9)
EBV VCA IgM: <36 U/mL (ref 0.0–35.9)

## 2024-06-07 LAB — CMV ABS, IGG+IGM (CYTOMEGALOVIRUS)
CMV Ab - IgG: 7.4 U/mL — ABNORMAL HIGH (ref 0.00–0.59)
CMV IgM Ser EIA-aCnc: <30 [AU]/ml (ref 0.0–29.9)

## 2024-06-07 LAB — HDV ANTIBODY, IGG/IGM: HDV Antibody, IgG/IgM: NONREACTIVE

## 2024-06-13 ENCOUNTER — Telehealth: Payer: Self-pay

## 2024-06-13 NOTE — Progress Notes (Signed)
 Complex Care Management Note Care Guide Note  06/13/2024 Name: Jeremy Rivas MRN: [id number] DOB: 1951/04/14   Complex Care Management Outreach Attempts: An unsuccessful telephone outreach was attempted today to offer the patient information about available complex care management services.  Follow Up Plan:  Additional outreach attempts will be made to offer the patient complex care management information and services.   Encounter Outcome:  No Answer-No voicemail- Unable to leave a message-Interpreter  Leotis Rase [facility], [facility] Guide  Direct Dial: [phone]  Fax [phone]

## 2024-06-15 NOTE — Progress Notes (Unsigned)
 Complex Care Management Note Care Guide Note  06/15/2024 Name: Jeremy Rivas MRN: [id number] DOB: August 09, 1950   Complex Care Management Outreach Attempts: A second unsuccessful outreach was attempted today to offer the patient with information about available complex care management services.  Follow Up Plan:  Additional outreach attempts will be made to offer the patient complex care management information and services.   Encounter Outcome:  No Answer-Pt answered but did not schedule with Interpreter.  Leotis Rase [facility], [facility] Guide  Direct Dial: [phone]  Fax [phone]

## 2024-06-18 NOTE — Progress Notes (Signed)
 Complex Care Management Note Care Guide Note  06/18/2024 Name: Jeremy Rivas MRN: [id number] DOB: [birth year]   Complex Care Management Outreach Attempts: A third unsuccessful outreach was attempted today to offer the patient with information about available complex care management services.  Follow Up Plan:  No further outreach attempts will be made at this time. We have been unable to contact the patient to offer or enroll patient in complex care management services.  Encounter Outcome:  No Answer-Pt hung up on Interpreter  Leotis Rase [facility], [facility] Guide  Direct Dial: [phone]  Fax [phone]

## 2024-06-19 ENCOUNTER — Ambulatory Visit: Payer: Self-pay | Admitting: Family Medicine

## 2024-06-20 ENCOUNTER — Ambulatory Visit: Payer: Self-pay | Admitting: Family Medicine

## 2024-06-20 NOTE — Telephone Encounter (Signed)
 Letter sent regarding results. No clear cause of acute hepatitis causing hospitalization. LFTs continuing to improve along with kidney function. Will need further evaluation per GI at his visit in January.

## 2024-07-06 ENCOUNTER — Ambulatory Visit: Admitting: Nurse Practitioner

## 2024-07-06 NOTE — Progress Notes (Deleted)
 "    07/06/2024 Jeremy Rivas [id number] Mar 26, 1951   Chief Complaint:  History of Present Illness:  ***  Discussed the use of AI scribe software for clinical note transcription with the patient, who gave verbal consent to proceed.  History of Present Illness   Past Medical History:  Diagnosis Date   Acute hepatitis 05/30/2024   Hearing impaired    Left ear    Hyperopia    No past surgical history on file.      Latest Ref Rng & Units 05/31/2024    4:41 AM 05/30/2024   11:23 AM 05/30/2024   11:21 AM  CBC  WBC 4.0 - 10.5 K/uL 2.4  3.3  3.1   Hemoglobin 13.0 - 17.0 g/dL 88.5  88.1  88.0   Hematocrit 39.0 - 52.0 % 33.8  34.6  33.7   Platelets 150 - 400 K/uL 129  154  153        Latest Ref Rng & Units 06/06/2024    9:20 AM 06/06/2024    9:17 AM 05/31/2024    4:41 AM  CMP  Glucose 70 - 99 mg/dL 83   97   BUN 8 - 27 mg/dL 12   18   Creatinine 9.23 - 1.27 mg/dL 8.65   8.52   Sodium 865 - 144 mmol/L 136   136   Potassium 3.5 - 5.2 mmol/L 4.1   4.7   Chloride 96 - 106 mmol/L 98   102   CO2 20 - 29 mmol/L 24   26   Calcium 8.6 - 10.2 mg/dL 7.7   7.8   Total Protein 6.0 - 8.5 g/dL  6.9  6.4   Total Bilirubin 0.0 - 1.2 mg/dL  0.6  2.4   Alkaline Phos 47 - 123 IU/L  101  90   AST 0 - 40 IU/L  94  1,022   ALT 0 - 44 IU/L  201  1,030     Current Medications, Allergies, Past Medical History, Past Surgical History, Family History and Social History were reviewed in [employer] record.   Review of Systems:   Constitutional: Negative for fever, sweats, chills or weight loss.  Respiratory: Negative for shortness of breath.   Cardiovascular: Negative for chest pain, palpitations and leg swelling.  Gastrointestinal: See HPI.  Musculoskeletal: Negative for back pain or muscle aches.  Neurological: Negative for dizziness, headaches or paresthesias.    Physical Exam: There were no vitals taken for this visit. General: in no acute distress. Head:  Normocephalic and atraumatic. Eyes: No scleral icterus. Conjunctiva pink . Ears: Normal auditory acuity. Mouth: Dentition intact. No ulcers or lesions.  Lungs: Clear throughout to auscultation. Heart: Regular rate and rhythm, no murmur. Abdomen: Soft, nontender and nondistended. No masses or hepatomegaly. Normal bowel sounds x 4 quadrants.  Rectal: *** Musculoskeletal: Symmetrical with no gross deformities. Extremities: No edema. Neurological: Alert oriented x 4. No focal deficits.  Psychological: Alert and cooperative. Normal mood and affect  Assessment and Recommendations:  74 year old male, non-English-speaking from [place] (speaks [place]) admitted 11/26 with N/V, upper abdominal pain and weight loss with acute transaminitis. T. Bili 1.4 -> 3.2. Alk phos 122 -> 94. AST 5,578 -> 1, 915 -> 1,022.  ALT 2,444, 1,381 -> 1,030. Acute hepatitis panel negative. CK1 116.  Iron 139.  Ferritin 2,655. HIV negative. Prior Hep B core total antibody positive 05/2020 which indicates past exposure vs past Hep B infection. Non-contrast CTAP identified a fatty liver without biliary ductal  dilatation and the gallbladder was unremarkable.  Hemodynamically stable. - Hep B DNA quant added to earlier a.m. lab draw, rule out reactivation of Hep B  - Hep D antibody in am (send out lab), if positive will require Hep D RNA level - CMV IgM and EBV IgM in am    H. Pylori breath test per PCP 05/29/2024 resulted positive - Defer H. Pylori treatment for now due to elevated LFTs    Mild normocytic anemia.    TSH 0.171 consistent with hyperthyroidism   Stable pulmonary nodules per CT   L1 compression fracture per CT  "

## 2024-07-10 ENCOUNTER — Telehealth: Payer: Self-pay

## 2024-07-10 NOTE — Telephone Encounter (Signed)
 Duplicate

## 2024-07-10 NOTE — Telephone Encounter (Signed)
 Patient missed follow up appointments with GI and with PCP. Appointment scheduled with PCP. Son Camellia called and informed.  Naomie Melaney Tellefsen RN BSN PCCN  [facility] [phone]-cell [phone]-office

## 2024-07-16 ENCOUNTER — Ambulatory Visit: Payer: Self-pay | Admitting: Family Medicine

## 2024-07-16 ENCOUNTER — Telehealth: Payer: Self-pay

## 2024-07-16 NOTE — Progress Notes (Unsigned)
" ° ° °  SUBJECTIVE:   CHIEF COMPLAINT / HPI: discuss labs  Discussed the use of AI scribe software for clinical note transcription with the patient, who gave verbal consent to proceed.  History of Present Illness     PERTINENT  PMH / PSH: Hx of strongyloides infection  OBJECTIVE:   There were no vitals taken for this visit.  Physical Exam    ASSESSMENT/PLAN:   Assessment & Plan  Assessment and Plan Assessment & Plan      No follow-ups on file.  Jeremy Provencal, MD, PGY-3 [facility] 8:06 AM 07/16/2024  [facility]   "

## 2024-07-16 NOTE — Telephone Encounter (Signed)
 Attempted to call patient and family for appointment reminder both last and night and this morning. No answer.  Naomie Treylon Henard RN BSN PCCN  [facility] [phone]-cell [phone]-office
